# Patient Record
Sex: Female | Born: 1965 | Race: White | Hispanic: No | Marital: Married | Smoking: Never smoker
Health system: Southern US, Community
[De-identification: ages and names within clinical notes are randomized; demographics above are authoritative.]

## PROBLEM LIST (undated history)

## (undated) DIAGNOSIS — R51 Headache: Secondary | ICD-10-CM

## (undated) DIAGNOSIS — N951 Menopausal and female climacteric states: Secondary | ICD-10-CM

## (undated) DIAGNOSIS — R519 Headache, unspecified: Secondary | ICD-10-CM

## (undated) DIAGNOSIS — F32A Depression, unspecified: Secondary | ICD-10-CM

## (undated) DIAGNOSIS — C4491 Basal cell carcinoma of skin, unspecified: Secondary | ICD-10-CM

## (undated) DIAGNOSIS — F419 Anxiety disorder, unspecified: Secondary | ICD-10-CM

## (undated) DIAGNOSIS — K635 Polyp of colon: Secondary | ICD-10-CM

## (undated) DIAGNOSIS — Z87828 Personal history of other (healed) physical injury and trauma: Secondary | ICD-10-CM

## (undated) DIAGNOSIS — T7840XA Allergy, unspecified, initial encounter: Secondary | ICD-10-CM

## (undated) HISTORY — DX: Personal history of other (healed) physical injury and trauma: Z87.828

## (undated) HISTORY — DX: Headache, unspecified: R51.9

## (undated) HISTORY — DX: Allergy, unspecified, initial encounter: T78.40XA

## (undated) HISTORY — DX: Menopausal and female climacteric states: N95.1

## (undated) HISTORY — DX: Polyp of colon: K63.5

## (undated) HISTORY — DX: Headache: R51

## (undated) HISTORY — PX: OTHER SURGICAL HISTORY: SHX169

## (undated) HISTORY — DX: Depression, unspecified: F32.A

## (undated) HISTORY — PX: DENTAL SURGERY: SHX609

## (undated) HISTORY — DX: Anxiety disorder, unspecified: F41.9

## (undated) HISTORY — DX: Basal cell carcinoma of skin, unspecified: C44.91

---

## 2000-04-25 ENCOUNTER — Other Ambulatory Visit: Admission: RE | Admit: 2000-04-25 | Discharge: 2000-04-25 | Payer: Self-pay | Admitting: Obstetrics and Gynecology

## 2002-03-05 ENCOUNTER — Other Ambulatory Visit: Admission: RE | Admit: 2002-03-05 | Discharge: 2002-03-05 | Payer: Self-pay | Admitting: Obstetrics and Gynecology

## 2003-05-16 ENCOUNTER — Other Ambulatory Visit: Admission: RE | Admit: 2003-05-16 | Discharge: 2003-05-16 | Payer: Self-pay | Admitting: Obstetrics and Gynecology

## 2004-06-12 ENCOUNTER — Other Ambulatory Visit: Admission: RE | Admit: 2004-06-12 | Discharge: 2004-06-12 | Payer: Self-pay | Admitting: Obstetrics and Gynecology

## 2005-08-04 ENCOUNTER — Other Ambulatory Visit: Admission: RE | Admit: 2005-08-04 | Discharge: 2005-08-04 | Payer: Self-pay | Admitting: Obstetrics and Gynecology

## 2007-09-28 HISTORY — PX: SHOULDER ARTHROSCOPY W/ ROTATOR CUFF REPAIR: SHX2400

## 2010-11-27 ENCOUNTER — Telehealth: Payer: Self-pay | Admitting: Internal Medicine

## 2010-12-03 NOTE — Progress Notes (Signed)
Summary: NEW PT in APRIL   Phone Note Call from Patient   Summary of Call: Pt set up apt for april - new to re-establish. She says she was seen by Dr Debby Bud a few years ago (no records as we can find).  She c/o slight racing pulse. Has had 2 to 3 epidsodes a few mths ago and last night had 3 more. Describes it as lasting approx 3 seconds and she has tried to feel her pulse but unable to notice any abnormality. No CP, sob or any other complaints. Pt only takes OTC estroven, no prescription meds. Advised her to be evaluated by UC or ER if symptoms returned or had any new symptoms - she agreed.  Initial call taken by: Lamar Sprinkles, CMA,  November 27, 2010 12:03 PM  Follow-up for Phone Call        yep Follow-up by: Jacques Navy MD,  November 27, 2010 2:16 PM

## 2011-01-15 ENCOUNTER — Ambulatory Visit (INDEPENDENT_AMBULATORY_CARE_PROVIDER_SITE_OTHER): Payer: BC Managed Care – PPO | Admitting: Internal Medicine

## 2011-01-15 ENCOUNTER — Encounter: Payer: Self-pay | Admitting: Internal Medicine

## 2011-01-15 VITALS — BP 110/78 | HR 62 | Temp 98.2°F | Wt 150.0 lb

## 2011-01-15 DIAGNOSIS — I872 Venous insufficiency (chronic) (peripheral): Secondary | ICD-10-CM | POA: Insufficient documentation

## 2011-01-15 DIAGNOSIS — G5700 Lesion of sciatic nerve, unspecified lower limb: Secondary | ICD-10-CM

## 2011-01-15 DIAGNOSIS — N951 Menopausal and female climacteric states: Secondary | ICD-10-CM

## 2011-01-15 DIAGNOSIS — I479 Paroxysmal tachycardia, unspecified: Secondary | ICD-10-CM | POA: Insufficient documentation
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
## 2016-01-25 ENCOUNTER — Encounter (HOSPITAL_COMMUNITY): Payer: Self-pay | Admitting: *Deleted

## 2016-01-25 ENCOUNTER — Ambulatory Visit (HOSPITAL_COMMUNITY)
Admission: EM | Admit: 2016-01-25 | Discharge: 2016-01-25 | Disposition: A | Discharge status: Home / Self Care | Payer: BLUE CROSS/BLUE SHIELD | Attending: Emergency Medicine | Admitting: Emergency Medicine

## 2016-01-25 DIAGNOSIS — M79602 Pain in left arm: Secondary | ICD-10-CM

## 2016-01-25 MED ORDER — VALACYCLOVIR HCL 1 G PO TABS: 1000.0000 mg | ORAL_TABLET | Freq: Three times a day (TID) | ORAL | Status: DC

## 2016-01-25 NOTE — ED Notes (Signed)
Pt  Has  A  Rash   And bruising  Appearance  To  The  l  Arm           Pt      Has     Pain   Down the  l    The arm  feelsw  Swollen  And  Tight   l  Hand  Is   Sore   As  Well            X   2  Days     Pt  Reports    Has  Had  Shingles

## 2016-01-25 NOTE — Discharge Instructions (Signed)
Heat Therapy Heat therapy can help ease sore, stiff, injured, and tight muscles and joints. Heat relaxes your muscles, which may help ease your pain.  RISKS AND COMPLICATIONS If you have any of the following conditions, do not use heat therapy unless your health care provider has approved:  Poor circulation.  Healing wounds or scarred skin in the area being treated.  Diabetes, heart disease, or high blood pressure.  Not being able to feel (numbness) the area being treated.  Unusual swelling of the area being treated.  Active infections.  Blood clots.  Cancer.  Inability to communicate pain. This may include young children and people who have problems with their brain function (dementia).  Pregnancy. Heat therapy should only be used on old, pre-existing, or long-lasting (chronic) injuries. Do not use heat therapy on new injuries unless directed by your health care provider. HOW TO USE HEAT THERAPY There are several different kinds of heat therapy, including:  Moist heat pack.  Warm water bath.  Hot water bottle.  Electric heating pad.  Heated gel pack.  Heated wrap.  Electric heating pad. Use the heat therapy method suggested by your health care provider. Follow your health care provider's instructions on when and how to use heat therapy. GENERAL HEAT THERAPY RECOMMENDATIONS  Do not sleep while using heat therapy. Only use heat therapy while you are awake.  Your skin may turn pink while using heat therapy. Do not use heat therapy if your skin turns red.  Do not use heat therapy if you have new pain.  High heat or long exposure to heat can cause burns. Be careful when using heat therapy to avoid burning your skin.  Do not use heat therapy on areas of your skin that are already irritated, such as with a rash or sunburn. SEEK MEDICAL CARE IF:  You have blisters, redness, swelling, or numbness.  You have new pain.  Your pain is worse. MAKE SURE  YOU:  Understand these instructions.  Will watch your condition.  Will get help right away if you are not doing well or get worse.   This information is not intended to replace advice given to you by your health care provider. Make sure you discuss any questions you have with your health care provider.   Document Released: 12/06/2011 Document Revised: 10/04/2014 Document Reviewed: 11/06/2013 Elsevier Interactive Patient Education 2016 Elsevier Inc.  Pain Without a Known Cause WHAT IS PAIN WITHOUT A KNOWN CAUSE? Pain can occur in any part of the body and can range from mild to severe. Sometimes no cause can be found for why you are having pain. Some types of pain that can occur without a known cause include:   Headache.  Back pain.  Abdominal pain.  Neck pain. HOW IS PAIN WITHOUT A KNOWN CAUSE DIAGNOSED?  Your health care provider will try to find the cause of your pain. This may include:  Physical exam.  Medical history.  Blood tests.  Urine tests.  X-rays. If no cause is found, your health care provider may diagnose you with pain without a known cause.  IS THERE TREATMENT FOR PAIN WITHOUT A CAUSE?  Treatment depends on the kind of pain you have. Your health care provider may prescribe medicines to help relieve your pain.  WHAT CAN I DO AT HOME FOR MY PAIN?   Take medicines only as directed by your health care provider.  Stop any activities that cause pain. During periods of severe pain, bed rest may help.  Try to reduce your stress with activities such as yoga or meditation. Talk to your health care provider for other stress-reducing activity recommendations.  Exercise regularly, if approved by your health care provider.  Eat a healthy diet that includes fruits and vegetables. This may improve pain. Talk to your health care provider if you have any questions about your diet. WHAT IF MY PAIN DOES NOT GET BETTER?  If you have a painful condition and no reason can be  found for the pain or the pain gets worse, it is important to follow up with your health care provider. It may be necessary to repeat tests and look further for a possible cause.    This information is not intended to replace advice given to you by your health care provider. Make sure you discuss any questions you have with your health care provider.   Document Released: 06/08/2001 Document Revised: 10/04/2014 Document Reviewed: 01/29/2014 Elsevier Interactive Patient Education 2016 Elsevier Inc. Phlebitis Phlebitis is soreness and puffiness (swelling) in a vein.  HOME CARE  Only take medicine as told by your doctor.  Raise (elevate) the affected limb on a pillow as told by your doctor.  Keep a warm pack on the affected vein as told by your doctor. Do not sleep with a heating pad.  Use special stockings or bandages around the area of the affected vein as told by your doctor. These will speed healing and keep the condition from coming back.  Talk to your doctor about all the medicines you take.  Get follow-up blood tests as told by your doctor.  If the phlebitis is in your legs:  Avoid standing or resting for long periods.  Keep your legs moving. Raise your legs when you sit or lie.  Do not smoke.  Follow-up with your doctor as told. GET HELP IF:  You have strange bruises or bleeding.  Your puffiness or pain in the affected area is not getting better.  You are taking medicine to lessen puffiness (anti-inflammatory medicine), and you get belly pain.  You have a fever. GET HELP RIGHT AWAY IF:   The phlebitis gets worse and you have more pain, puffiness (swelling), or redness.  You have trouble breathing or have chest pain. MAKE SURE YOU:   Understand these instructions.  Will watch your condition.  Will get help right away if you are not doing well or get worse.   This information is not intended to replace advice given to you by your health care provider. Make sure  you discuss any questions you have with your health care provider.   Document Released: 09/01/2009 Document Revised: 09/18/2013 Document Reviewed: 05/21/2013 Elsevier Interactive Patient Education 2016 [place] is an infection that causes a painful skin rash and fluid-filled blisters. Shingles is caused by the same virus that causes chickenpox. Shingles only develops in people who:  Have had chickenpox.  Have gotten the chickenpox vaccine. (This is rare.) The first symptoms of shingles may be itching, tingling, or pain in an area on your skin. A rash will follow in a few days or weeks. The rash is usually on one side of the body in a bandlike or beltlike pattern. Over time, the rash turns into fluid-filled blisters that break open, scab over, and dry up. Medicines may:  Help you manage pain.  Help you recover more quickly.  Help to prevent long-term problems. HOME CARE Medicines  Take medicines only as told by your doctor.  Apply an anti-itch  or numbing cream to the affected area as told by your doctor. Blister and Rash Care  Take a cool bath or put cool compresses on the area of the rash or blisters as told by your doctor. This may help with pain and itching.  Keep your rash covered with a loose bandage (dressing). Wear loose-fitting clothing.  Keep your rash and blisters clean with mild soap and cool water or as told by your doctor.  Check your rash every day for signs of infection. These include redness, swelling, and pain that lasts or gets worse.  Do not pick your blisters.  Do not scratch your rash. General Instructions  Rest as told by your doctor.  Keep all follow-up visits as told by your doctor. This is important.  Until your blisters scab over, your infection can cause chickenpox in people who have never had it or been vaccinated against it. To prevent this from happening, avoid touching other people or being around other people,  especially:  Babies.  Pregnant women.  Children who have eczema.  Elderly people who have transplants.  People who have chronic illnesses, such as leukemia or AIDS. GET HELP IF:  Your pain does not get better with medicine.  Your pain does not get better after the rash heals.  Your rash looks infected. Signs of infection include:  Redness.  Swelling.  Pain that lasts or gets worse. GET HELP RIGHT AWAY IF:  The rash is on your face or nose.  You have pain in your face, pain around your eye area, or loss of feeling on one side of your face.  You have ear pain or you have ringing in your ear.  You have loss of taste.  Your condition gets worse.   This information is not intended to replace advice given to you by your health care provider. Make sure you discuss any questions you have with your health care provider.   Document Released: 03/01/2008 Document Revised: 10/04/2014 Document Reviewed: 06/25/2014 Elsevier Interactive Patient Education [employer].
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
## 2017-12-05 ENCOUNTER — Encounter: Payer: Self-pay | Admitting: Gastroenterology

## 2017-12-21 ENCOUNTER — Other Ambulatory Visit: Payer: Self-pay

## 2017-12-21 ENCOUNTER — Ambulatory Visit (AMBULATORY_SURGERY_CENTER): Payer: Self-pay | Admitting: *Deleted

## 2017-12-21 VITALS — Ht 68.0 in | Wt 154.6 lb

## 2017-12-21 DIAGNOSIS — Z1211 Encounter for screening for malignant neoplasm of colon: Secondary | ICD-10-CM

## 2017-12-21 MED ORDER — NA SULFATE-K SULFATE-MG SULF 17.5-3.13-1.6 GM/177ML PO SOLN: 1.0000 [IU] | Freq: Once | ORAL | 0 refills | Status: AC

## 2017-12-21 NOTE — Progress Notes (Signed)
No egg or soy allergy known to patient  No issues with past sedation with any surgeries  or procedures, no intubation problems  No diet pills per patient No home 02 use per patient  No blood thinners per patient  Pt denies issues with constipation  No A fib or A flutter  EMMI video sent to pt's e mail  

## 2017-12-23 ENCOUNTER — Encounter: Payer: Self-pay | Admitting: Family Medicine

## 2017-12-23 ENCOUNTER — Other Ambulatory Visit: Payer: Self-pay

## 2017-12-23 ENCOUNTER — Ambulatory Visit (INDEPENDENT_AMBULATORY_CARE_PROVIDER_SITE_OTHER): Payer: 59 | Admitting: Family Medicine

## 2017-12-23 VITALS — BP 122/80 | HR 90 | Temp 97.7°F | Resp 16 | Ht 68.0 in | Wt 157.4 lb

## 2017-12-23 DIAGNOSIS — R51 Headache: Secondary | ICD-10-CM | POA: Diagnosis not present

## 2017-12-23 DIAGNOSIS — N941 Unspecified dyspareunia: Secondary | ICD-10-CM | POA: Diagnosis not present

## 2017-12-23 DIAGNOSIS — Z23 Encounter for immunization: Secondary | ICD-10-CM

## 2017-12-23 DIAGNOSIS — E28319 Asymptomatic premature menopause: Secondary | ICD-10-CM | POA: Diagnosis not present

## 2017-12-23 DIAGNOSIS — R519 Headache, unspecified: Secondary | ICD-10-CM

## 2017-12-23 DIAGNOSIS — Z0000 Encounter for general adult medical examination without abnormal findings: Secondary | ICD-10-CM

## 2017-12-23 DIAGNOSIS — R0789 Other chest pain: Secondary | ICD-10-CM

## 2017-12-23 LAB — LIPID PANEL
CHOL/HDL RATIO: 3
CHOLESTEROL: 188 mg/dL (ref 0–200)
HDL: 66.5 mg/dL (ref >39.00)
LDL CALC: 107 mg/dL — AB (ref 0–99)
NonHDL: 121.32
TRIGLYCERIDES: 73 mg/dL (ref 0.0–149.0)
VLDL: 14.6 mg/dL (ref 0.0–40.0)

## 2017-12-23 LAB — COMPREHENSIVE METABOLIC PANEL
ALT: 12 U/L (ref 0–35)
AST: 19 U/L (ref 0–37)
Albumin: 4.3 g/dL (ref 3.5–5.2)
Alkaline Phosphatase: 52 U/L (ref 39–117)
BUN: 10 mg/dL (ref 6–23)
CHLORIDE: 102 meq/L (ref 96–112)
CO2: 32 meq/L (ref 19–32)
Calcium: 9.6 mg/dL (ref 8.4–10.5)
Creatinine, Ser: 0.81 mg/dL (ref 0.40–1.20)
GFR: 78.39 mL/min (ref >60.00)
Glucose, Bld: 103 mg/dL — ABNORMAL HIGH (ref 70–99)
POTASSIUM: 3.5 meq/L (ref 3.5–5.1)
SODIUM: 141 meq/L (ref 135–145)
Total Bilirubin: 0.5 mg/dL (ref 0.2–1.2)
Total Protein: 7.3 g/dL (ref 6.0–8.3)

## 2017-12-23 LAB — CBC WITH DIFFERENTIAL/PLATELET
BASOS PCT: 0.8 % (ref 0.0–3.0)
Basophils Absolute: 0 10*3/uL (ref 0.0–0.1)
EOS ABS: 0.2 10*3/uL (ref 0.0–0.7)
EOS PCT: 2.7 % (ref 0.0–5.0)
HCT: 37.7 % (ref 36.0–46.0)
HEMOGLOBIN: 12.8 g/dL (ref 12.0–15.0)
LYMPHS ABS: 1.7 10*3/uL (ref 0.7–4.0)
Lymphocytes Relative: 30.9 % (ref 12.0–46.0)
MCHC: 33.9 g/dL (ref 30.0–36.0)
MCV: 91.7 fl (ref 78.0–100.0)
MONO ABS: 0.6 10*3/uL (ref 0.1–1.0)
Monocytes Relative: 10.2 % (ref 3.0–12.0)
NEUTROS ABS: 3.1 10*3/uL (ref 1.4–7.7)
Neutrophils Relative %: 55.4 % (ref 43.0–77.0)
PLATELETS: 270 10*3/uL (ref 150.0–400.0)
RBC: 4.11 Mil/uL (ref 3.87–5.11)
RDW: 13 % (ref 11.5–15.5)
WBC: 5.6 10*3/uL (ref 4.0–10.5)

## 2017-12-23 LAB — TSH: TSH: 1.52 u[IU]/mL (ref 0.35–4.50)

## 2017-12-23 LAB — SEDIMENTATION RATE: Sed Rate: 16 mm/hr (ref 0–30)
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
## 2017-12-23 NOTE — Patient Instructions (Signed)
Please return in 12 months for your annual complete physical; please come fasting.  I will release your lab results to you on your MyChart account with further instructions. Please reply with any questions.   Please go to [facility] on [address] for your [place], [address] [place], [place]    It was a pleasure meeting you today! Thank you for choosing [place] to meet your healthcare needs! I truly look forward to working with you. If you have any questions or concerns, please send me a message via Mychart or call the office at [phone].  Please do these things to maintain good health!   Exercise at least 30-45 minutes a day,  4-5 days a week.   Eat a low-fat diet with lots of fruits and vegetables, up to 7-9 servings per day.  Drink plenty of water daily. Try to drink 8 8oz glasses per day.  Seatbelts can save your life. Always wear your seatbelt.  Place Smoke Detectors on every level of your home and check batteries every year.  Schedule an appointment with an eye doctor for an eye exam every 1-2 years  Safe sex - use condoms to protect yourself from STDs if you could be exposed to these types of infections. Use birth control if you do not want to become pregnant and are sexually active.  Avoid heavy alcohol use. If you drink, keep it to less than 2 drinks/day and not every day.  [place].  Choose someone you trust that could speak for you if you became unable to speak for yourself.  Depression is common in our stressful world.If you're feeling down or losing interest in things you normally enjoy, please come in for a visit.  If anyone is threatening or hurting you, please get help. Physical or Emotional Violence is never OK.

## 2017-12-24 LAB — HEPATITIS C ANTIBODY
HEP C AB: NONREACTIVE
SIGNAL TO CUT-OFF: 0.01 (ref <1.00)

## 2017-12-24 LAB — HIV ANTIBODY (ROUTINE TESTING W REFLEX): HIV 1&2 Ab, 4th Generation: NONREACTIVE

## 2017-12-26 ENCOUNTER — Other Ambulatory Visit: Payer: Self-pay | Admitting: Family Medicine

## 2017-12-26 ENCOUNTER — Ambulatory Visit
Admission: RE | Admit: 2017-12-26 | Discharge: 2017-12-26 | Disposition: A | Discharge status: Home / Self Care | Payer: 59 | Source: Ambulatory Visit | Attending: Family Medicine | Admitting: Family Medicine

## 2017-12-26 DIAGNOSIS — R0789 Other chest pain: Secondary | ICD-10-CM

## 2017-12-26 NOTE — Progress Notes (Signed)
Please call patient: I have reviewed his/her lab results. Chest xray and spine films are all normal as well. Follow up if pain recurs or worsens. thanks

## 2017-12-26 NOTE — Progress Notes (Signed)
Please call patient: I have reviewed his/her lab results. All blood tests are normal. No abnormal inflammatory markers or muscle/bone markers to cause alarm regarding chest wall pain. Awaiting xray results. No further testing needed at this time based on these results. Can send letter with results as well. I've routed one to you.

## 2017-12-27 ENCOUNTER — Other Ambulatory Visit: Payer: Self-pay

## 2017-12-27 ENCOUNTER — Encounter: Payer: Self-pay | Admitting: Family Medicine

## 2017-12-27 ENCOUNTER — Ambulatory Visit (INDEPENDENT_AMBULATORY_CARE_PROVIDER_SITE_OTHER): Payer: 59 | Admitting: Family Medicine

## 2017-12-27 ENCOUNTER — Ambulatory Visit: Payer: Self-pay | Admitting: *Deleted

## 2017-12-27 VITALS — BP 130/70 | HR 65 | Temp 98.0°F | Ht 68.0 in | Wt 157.8 lb

## 2017-12-27 DIAGNOSIS — R404 Transient alteration of awareness: Secondary | ICD-10-CM | POA: Diagnosis not present

## 2017-12-27 DIAGNOSIS — R42 Dizziness and giddiness: Secondary | ICD-10-CM | POA: Diagnosis not present

## 2017-12-27 NOTE — Progress Notes (Signed)
Subjective  CC:  Chief Complaint  Patient presents with  . Racing Thoughts    Patient states that she has feelings of impending doom, and she states that her stomach sinks , like her heart falls to her feet.     HPI: Krystal Rose is a 52 y.o. female who presents to the office today to address the problems listed above in the chief complaint.  Please see recent telephone note with information.   Pt reports about 6 months of "episodes or spells" consisting of feelings of deja vu or past experiences associated with sinking feel and impending doom. Happened in early am upon awakening or getting ready in am initially. No other associated sxs at that time but very disconcerting feeling. sxs last for < 1 minute. Started after daughter went to [employer]. More recently, had stress increase due to selling town home and home sick daughter and work. Had multiple similar episodes recently, twice at lunch, and now associated with light headedness.   DENIES headaches, racing heart, sweats, tremor, n/v, paresthesias, paresis. Sleep has been interrupted. She admits to worry about her daughter. No h/o mood problems. She does have a h/o headaches but never diagnosed with migraines. No FH of seizure d/o. Google brought up idea of focal seizures. She is otherwise feeling fine now.   Recent cpe with nl labs.   I reviewed the patients updated PMH, FH, and SocHx.    Patient Active Problem List   Diagnosis Date Noted  . Headache disorder 12/23/2017  . Premature menopause 12/23/2017  . Dyspareunia in female 12/23/2017   Current Meds  Medication Sig  . tretinoin (RETIN-A) 0.01 % gel Apply topically at bedtime.    Allergies: Patient is allergic to elastic bandages & [zinc] and latex. Family History: Patient family history includes Arthritis in her mother; Asthma in her paternal grandfather; COPD in her paternal grandfather; Cancer in her paternal grandfather; Diabetes in her paternal  grandfather; Drug abuse in her paternal grandfather; Healthy in her brother, daughter, daughter, and father; Hearing loss in her maternal grandmother and mother; Heart attack in her maternal grandmother; Heart disease in her maternal grandmother; Heart disease (age of onset: 19) in her maternal uncle; Hyperlipidemia in her mother; Hypertension in her mother; Rheum arthritis in her mother; Stomach cancer in her maternal grandfather. Social History:  Patient  reports that she has never smoked. She has never used smokeless tobacco. She reports that she drinks alcohol. She reports that she does not use drugs.  Review of Systems: Constitutional: Negative for fever malaise or anorexia Cardiovascular: negative for chest pain Respiratory: negative for SOB or persistent cough Gastrointestinal: negative for abdominal pain  Objective  Vitals: BP 130/70   Pulse 65   Temp 98 F (36.7 C)   Ht 5\' 8"  (1.727 m)   Wt 157 lb 12.8 oz (71.6 kg)   BMI 23.99 kg/m  General: no acute distress , A&Ox3 Psych: nl affect, good insight,  nonfocal neuro exam 3/29 not repeated today  Assessment  1. Spell of altered consciousness   2. Lightheadedness      Plan   spells:  ? Etiology: migraine equivalents vs seizure d/o vs stress reaction vs other. Refer to neuro for evaluation. rec stress reduction/mgt. Monitor for sxs of panic or headaches.   Follow up: referred to neuro.     Commons side effects, risks, benefits, and alternatives for medications and treatment plan prescribed today were discussed, and the patient expressed understanding of the  given instructions. Patient is instructed to call or message via MyChart if he/she has any questions or concerns regarding our treatment plan. No barriers to understanding were identified. We discussed Red Flag symptoms and signs in detail. Patient expressed understanding regarding what to do in case of urgent or emergency type symptoms.   Medication list was reconciled,  printed and provided to the patient in AVS. Patient instructions and summary information was reviewed with the patient as documented in the AVS. This note was prepared with assistance of Dragon voice recognition software. Occasional wrong-word or sound-a-like substitutions may have occurred due to the inherent limitations of voice recognition software  Orders Placed This Encounter  Procedures  . Ambulatory referral to Neurology   No orders of the defined types were placed in this encounter.

## 2017-12-27 NOTE — Telephone Encounter (Signed)
Pt called and stated that about 6 months ago she felt a strange since of something that happened years ago, then her stomach sank like she followed and anvil, and then it was gone; she had another episode about 2 weeks later, than multiple episodes; these most happen upon waking in the morning; on Saturday 12/24/17 she had 1 episode in bed, 2 at lunch, 1 when walkiing with neighbor, and 1 at dinner; she says that she also felt pronounced "deja vu"; she did have another episode on 12/26/17; the pt says that she googled this and said that she thinks she is having simple partial seizures; nurse triage initiated and recommendations made per protocol to include seeing a physician within 24 hours; pt offered and accepted appointment with Dr Jonni Sanger, [facility]age today at [phone]; she verbalizes understanding; the pt was seen in office yesterday and had an episode last night; spoke with Benjamine Mola regarding this appointment; will route to office for notification of this upcoming appointment. . Reason for Disposition . 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 ONSET: "How long did the seizure last?" (Minutes)      The episodes last about 30-45 seconds 2. CONTENT: "Describe what happened during the seizure. Did the body become stiff? Was there any jerking?"      no 3. CIRCUMSTANCE: "What was the individual doing when the seizure began?"      They typically happen in the morning when she is in twilight slep or when she gets up 4. MENTAL STATUS: "Does he know who he is, who you are, and where he is?"      yes 5. PRIOR SEIZURES: "Has the individual had a seizure (convulsion) before?" If so, ask: "When was the last time?" and "What happened last time?"     no 6. EPILEPSY: "Does the individual have epilepsy?" (note: check for medical ID bracelet)     no 7. MEDICATIONS: "Does the individual take anticonvulsant medications?" (e.g., yes/no, compliance, any  recent changes)     no 8. INJURY: "Did the individual hurt himself during the seizure?" (e.g., head, tongue)     no 9. OTHER SYMPTOMS: "Are there any other symptoms?" (e.g., fever, headache)     Light headed when walking on 12/24/17 and numbness in upper body when eating dinner on 12/24/17 10. PREGNANCY: "Is there any chance you are pregnant?" "When was your last menstrual period?"       No menopause  Protocols used: [facility]

## 2017-12-27 NOTE — Telephone Encounter (Signed)
Routing to PCP. Not sure why this was sent to me.

## 2017-12-27 NOTE — Patient Instructions (Signed)
Please follow up if symptoms do not improve or as needed.   We will call you with information regarding your referral appointment. Neurology for evaluation.

## 2018-01-02 ENCOUNTER — Ambulatory Visit (AMBULATORY_SURGERY_CENTER): Payer: 59 | Admitting: Gastroenterology

## 2018-01-02 ENCOUNTER — Encounter: Payer: Self-pay | Admitting: Gastroenterology

## 2018-01-02 ENCOUNTER — Other Ambulatory Visit: Payer: Self-pay

## 2018-01-02 VITALS — BP 106/68 | HR 62 | Temp 98.4°F | Resp 12 | Ht 68.0 in | Wt 154.0 lb

## 2018-01-02 DIAGNOSIS — D122 Benign neoplasm of ascending colon: Secondary | ICD-10-CM | POA: Diagnosis not present

## 2018-01-02 DIAGNOSIS — Z1211 Encounter for screening for malignant neoplasm of colon: Secondary | ICD-10-CM | POA: Diagnosis not present

## 2018-01-02 DIAGNOSIS — D123 Benign neoplasm of transverse colon: Secondary | ICD-10-CM

## 2018-01-02 MED ORDER — SODIUM CHLORIDE 0.9 % IV SOLN: 500.0000 mL | Freq: Once | INTRAVENOUS | Status: DC

## 2018-01-02 NOTE — Progress Notes (Signed)
Pt's states no medical or surgical changes since previsit or office visit. 

## 2018-01-02 NOTE — Progress Notes (Signed)
To recovery, report to RN, VSS. 

## 2018-01-02 NOTE — Progress Notes (Signed)
Called to room to assist during endoscopic procedure.  Patient ID and intended procedure confirmed with present staff. Received instructions for my participation in the procedure from the performing physician.  

## 2018-01-02 NOTE — Op Note (Signed)
[place] Patient Name: Parris Signer Procedure Date: 01/02/2018 10:26 AM MRN: [id number] Endoscopist: Mauri Pole , MD Age: 52 Referring MD:  Date of Birth: 1966/06/09 Gender: Female Account #: [id number] Procedure:                Colonoscopy Indications:              Screening for colorectal malignant neoplasm, This                            is the patient's first colonoscopy Medicines:                Monitored Anesthesia Care Procedure:                Pre-Anesthesia Assessment:                           - Prior to the procedure, a History and Physical                            was performed, and patient medications and                            allergies were reviewed. The patient's tolerance of                            previous anesthesia was also reviewed. The risks                            and benefits of the procedure and the sedation                            options and risks were discussed with the patient.                            All questions were answered, and informed consent                            was obtained. Prior Anticoagulants: The patient has                            taken no previous anticoagulant or antiplatelet                            agents. ASA Grade Assessment: I - A normal, healthy                            patient. After reviewing the risks and benefits,                            the patient was deemed in satisfactory condition to                            undergo the procedure.  After obtaining informed consent, the colonoscope                            was passed under direct vision. Throughout the                            procedure, the patient's blood pressure, pulse, and                            oxygen saturations were monitored continuously. The                            Colonoscope was introduced through the anus and                            advanced to the the cecum,  identified by                            appendiceal orifice and ileocecal valve. The                            colonoscopy was performed without difficulty. The                            patient tolerated the procedure well. The quality                            of the bowel preparation was excellent. The                            ileocecal valve, appendiceal orifice, and rectum                            were photographed. Scope In: 10:32:05 AM Scope Out: 10:59:23 AM Scope Withdrawal Time: 0 hours 17 minutes 5 seconds  Total Procedure Duration: 0 hours 27 minutes 18 seconds  Findings:                 The perianal and digital rectal examinations were                            normal.                           Two sessile polyps were found in the transverse                            colon and ascending colon. The polyps were 9 to 12                            mm in size. These polyps were removed with a cold                            snare. Resection and retrieval were complete.  A 3 mm polyp was found in the transverse colon. The                            polyp was flat. The polyp was removed with a cold                            biopsy forceps. Resection and retrieval were                            complete.                           Non-bleeding internal hemorrhoids were found during                            retroflexion. The hemorrhoids were small. Complications:            No immediate complications. Estimated Blood Loss:     Estimated blood loss was minimal. Impression:               - Two 9 to 12 mm polyps in the transverse colon and                            in the ascending colon, removed with a cold snare.                            Resected and retrieved.                           - One 3 mm polyp in the transverse colon, removed                            with a cold biopsy forceps. Resected and retrieved.                           -  Non-bleeding internal hemorrhoids. Recommendation:           - Patient has a contact number available for                            emergencies. The signs and symptoms of potential                            delayed complications were discussed with the                            patient. Return to normal activities tomorrow.                            Written discharge instructions were provided to the                            patient.                           -  Resume previous diet.                           - Continue present medications.                           - Await pathology results.                           - Repeat colonoscopy in 3 years for surveillance                            based on pathology results. Mauri Pole, MD 01/02/2018 11:05:17 AM This report has been signed electronically.

## 2018-01-02 NOTE — Patient Instructions (Signed)
INFORMATION ON POLYPS GIVEN.   YOU HAD AN ENDOSCOPIC PROCEDURE TODAY AT THE [facility]:   Refer to the procedure report that was given to you for any specific questions about what was found during the examination.  If the procedure report does not answer your questions, please call your gastroenterologist to clarify.  If you requested that your care partner not be given the details of your procedure findings, then the procedure report has been included in a sealed envelope for you to review at your convenience later.  YOU SHOULD EXPECT: Some feelings of bloating in the abdomen. Passage of more gas than usual.  Walking can help get rid of the air that was put into your GI tract during the procedure and reduce the bloating. If you had a lower endoscopy (such as a colonoscopy or flexible sigmoidoscopy) you may notice spotting of blood in your stool or on the toilet paper. If you underwent a bowel prep for your procedure, you may not have a normal bowel movement for a few days.  Please Note:  You might notice some irritation and congestion in your nose or some drainage.  This is from the oxygen used during your procedure.  There is no need for concern and it should clear up in a day or so.  SYMPTOMS TO REPORT IMMEDIATELY:   Following lower endoscopy (colonoscopy or flexible sigmoidoscopy):  Excessive amounts of blood in the stool  Significant tenderness or worsening of abdominal pains  Swelling of the abdomen that is new, acute  Fever of 100F or higher    For urgent or emergent issues, a gastroenterologist can be reached at any hour by calling [phone].   DIET:  We do recommend a small meal at first, but then you may proceed to your regular diet.  Drink plenty of fluids but you should avoid alcoholic beverages for 24 hours.  ACTIVITY:  You should plan to take it easy for the rest of today and you should NOT DRIVE or use heavy machinery until tomorrow (because of the sedation  medicines used during the test).    FOLLOW UP: Our staff will call the number listed on your records the next business day following your procedure to check on you and address any questions or concerns that you may have regarding the information given to you following your procedure. If we do not reach you, we will leave a message.  However, if you are feeling well and you are not experiencing any problems, there is no need to return our call.  We will assume that you have returned to your regular daily activities without incident.  If any biopsies were taken you will be contacted by phone or by letter within the next 1-3 weeks.  Please call us at [phone] if you have not heard about the biopsies in 3 weeks.    SIGNATURES/CONFIDENTIALITY: You and/or your care partner have signed paperwork which will be entered into your electronic medical record.  These signatures attest to the fact that that the information above on your After Visit Summary has been reviewed and is understood.  Full responsibility of the confidentiality of this discharge information lies with you and/or your care-partner. 

## 2018-01-03 ENCOUNTER — Telehealth: Payer: Self-pay | Admitting: *Deleted

## 2018-01-03 ENCOUNTER — Encounter: Payer: Self-pay | Admitting: Neurology

## 2018-01-03 NOTE — Telephone Encounter (Signed)
  Follow up Call-  Call back number 01/02/2018  Post procedure Call Back phone  # [phone]  Permission to leave phone message Yes  Some recent data might be hidden     Patient questions:  Do you have a fever, pain , or abdominal swelling? No. Pain Score  0 *  Have you tolerated food without any problems? Yes.    Have you been able to return to your normal activities? Yes.    Do you have any questions about your discharge instructions: Diet   No. Medications  No. Follow up visit  No.  Do you have questions or concerns about your Care? No.  Actions: * If pain score is 4 or above: No action needed, pain <4.

## 2018-01-03 NOTE — Telephone Encounter (Signed)
  Follow up Call-  Call back number 01/02/2018  Post procedure Call Back phone  # [phone]  Permission to leave phone message Yes  Some recent data might be hidden     Patient questions:  Message left to call us if necessary.

## 2018-01-05 ENCOUNTER — Encounter: Payer: Self-pay | Admitting: Gastroenterology

## 2018-01-06 ENCOUNTER — Encounter: Payer: Self-pay | Admitting: Family Medicine

## 2018-01-06 DIAGNOSIS — K635 Polyp of colon: Secondary | ICD-10-CM

## 2018-01-06 HISTORY — DX: Polyp of colon: K63.5

## 2018-02-21 LAB — HM MAMMOGRAPHY

## 2018-03-27 ENCOUNTER — Other Ambulatory Visit: Payer: Self-pay

## 2018-03-27 ENCOUNTER — Ambulatory Visit (INDEPENDENT_AMBULATORY_CARE_PROVIDER_SITE_OTHER): Payer: 59 | Admitting: Neurology

## 2018-03-27 ENCOUNTER — Encounter: Payer: Self-pay | Admitting: Neurology

## 2018-03-27 VITALS — BP 126/60 | HR 67 | Ht 68.0 in | Wt 151.0 lb

## 2018-03-27 DIAGNOSIS — R29818 Other symptoms and signs involving the nervous system: Secondary | ICD-10-CM

## 2018-03-27 NOTE — Patient Instructions (Addendum)
1. Schedule MRI brain with and without contrast  We have sent a referral to [facility] for your MRI and they will call you directly to schedule your appt. They are located at [place]. If you need to contact them directly please call [phone].   2. Schedule 1-hour sleep-deprived EEG, then 48-hour EEG (if routine EEG normal) 3. Follow-up after tests, call for any changes

## 2018-03-27 NOTE — Progress Notes (Signed)
NEUROLOGY CONSULTATION NOTE  Krystal Rose MRN: [id number] DOB: 11-Sep-1966  Referring provider: Dr. Billey Chang Primary care provider: Dr. Billey Chang  Reason for consult:  Spells of altered consciousness  Dear Dr Jonni Sanger:  Thank you for your kind referral of Krystal Rose for consultation of the above symptoms. Although her history is well known to you, please allow me to reiterate it for the purpose of our medical record. She is alone in the office today. Records and images were personally reviewed where available.  HISTORY OF PRESENT ILLNESS: This is a very pleasant 52 year old right-handed woman with no significant past medical history, in her usual state of health until around October 2018 when she started having recurrent episodes of deja vu with associated epigastric sensation. 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 This was followed by a terrible sinking in her stomach like a drop on a roller coaster, then a wave of deep despair. Symptoms lasted 30 seconds or so, but recalls the sensation as feeling "horrible and painful." She had 2-3 of these over the next month or so, all occurring upon awakening. For a month she was symptom-free, then symptoms recurred occurring every 1-2 weeks, sometimes in clusters of 3-4 in 2 weeks. She described some of them, in February she had 4 in one night. In March, she had 3 episodes. With one of them, she had 5 in one day, one with associated lightheadedness like she would faint, feeling tingly in her face, then another where she felt tingling all over. The next day, she had a milder episode while in bed, again with some kind of memory like a classroom/library. In May she had 2 episodes in one day upon waking, she felt cool/tingly around the lips. She had 4 episodes in June, with one of them she had a familiar memory of a person who she was not  sure if she knew, then her stomach felt upset with nausea and lip tingling. The next day she had 3 milder episodes early morning, waking up up from sleep. She had 2 more that day lasting 1-2 minutes. The last episode occurred on 03/24/18, it was different because she was looking at a specific accent table picture online and felt the same sensation, she tried to scroll through other tables, then came back to the same table and again had the sensation. There is no associated olfactory or gustatory hallucination except for one time when she had several in one night and smelled lemons in their bedroom. No associated focal weakness or post-event fatigue. No staring/unresponsive episodes, gaps in time, or myoclonic jerks. Sometimes she feels a sensation that it is going to happen, like if she looks to the right side it may occur. No clear triggers, maybe sleep. She gets an average of 6-7 hours of sleep. She has had headaches recently, but unrelated to these. She has headaches around once a week, either on the right hemisphere or the top of her head, no associated nausea/vomiting/phono/photophobia, with good response to Excedrin migraine. She feels the vision on her right eye varies, she had an eye exam with no significant abnormality reported. She recalls one episode 2 weeks ago while drinking 1 glass of martini when she felt she was slurring her speech for an hour, no other associated symptoms. She had pain in her shoulder blades 11 months ago and could not  move for 3 days, this occurred at the peak of her stress when her daughter went to start [employer]. She reports that all of the above symptoms started around the time of stress with her daughter.   Epilepsy Risk Factors:  She had a normal birth and early development.  There is no history of febrile convulsions, CNS infections such as meningitis/encephalitis, significant traumatic brain injury, neurosurgical procedures, or family history of  seizures.  PAST MEDICAL HISTORY: Past Medical History:  Diagnosis Date  . Allergy   . Basal cell carcinoma   . Benign colon polyp 01/06/2018   Colonoscopy 12/2017  . Frequent headaches   . History of whiplash injury to neck '04   recovered  . Menopausal state     PAST SURGICAL HISTORY: Past Surgical History:  Procedure Laterality Date  . basal cell carcinoma removed     09/2017  . DENTAL SURGERY     apicoectomy '06  . SHOULDER ARTHROSCOPY W/ ROTATOR CUFF REPAIR  2009   left  shoulder    MEDICATIONS: Current Outpatient Medications on File Prior to Visit  Medication Sig Dispense Refill  . conjugated estrogens (PREMARIN) vaginal cream Place 1 Applicatorful vaginally daily.    Marland Kitchen ibuprofen (ADVIL,MOTRIN) 200 MG tablet Take 400 mg by mouth every 8 (eight) hours as needed.    . tretinoin (RETIN-A) 0.01 % gel Apply topically at bedtime.     Current Facility-Administered Medications on File Prior to Visit  Medication Dose Route Frequency Provider Last Rate Last Dose  . 0.9 %  sodium chloride infusion  500 mL Intravenous Once Nandigam, Venia Minks, MD        ALLERGIES: Allergies  Allergen Reactions  . Elastic Bandages & [Zinc]     Elastic in bathing suits and causes contact dermatitis  . Latex     Causes contact dermatitis    FAMILY HISTORY: Family History  Problem Relation Age of Onset  . Hypertension Mother   . Hyperlipidemia Mother   . Rheum arthritis Mother   . Arthritis Mother   . Hearing loss Mother   . Healthy Father   . Diabetes Paternal Grandfather   . COPD Paternal Grandfather   . Asthma Paternal Grandfather   . Cancer Paternal Grandfather   . Drug abuse Paternal Grandfather   . Healthy Brother   . Heart disease Maternal Uncle 80       died of MI  . Heart disease Maternal Grandmother   . Hearing loss Maternal Grandmother   . Heart attack Maternal Grandmother   . Stomach cancer Maternal Grandfather   . Healthy Daughter   . Healthy Daughter   . Colon  polyps Neg Hx   . Colon cancer Neg Hx   . Esophageal cancer Neg Hx   . Rectal cancer Neg Hx     SOCIAL HISTORY: Social History   Socioeconomic History  . Marital status: Married    Spouse name: Not on file  . Number of children: 2  . Years of education: 39  . Highest education level: Not on file  Occupational History  . Occupation: Nurse, children's, vestibular    Comment: works at [employer] 2 days per week  Social Needs  . Financial resource strain: Not on file  . Food insecurity:    Worry: Not on file    Inability: Not on file  . Transportation needs:    Medical: Not on file    Non-medical: Not on file  Tobacco Use  . Smoking  status: Never Smoker  . Smokeless tobacco: Never Used  Substance and Sexual Activity  . Alcohol use: Yes    Alcohol/week: 0.3 oz    Types: 1 Standard drinks or equivalent per week    Comment: occasionally  . Drug use: No  . Sexual activity: Yes    Partners: Male  Lifestyle  . Physical activity:    Days per week: Not on file    Minutes per session: Not on file  . Stress: Not on file  Relationships  . Social connections:    Talks on phone: Not on file    Gets together: Not on file    Attends religious service: Not on file    Active member of club or organization: Not on file    Attends meetings of clubs or organizations: Not on file    Relationship status: Not on file  . Intimate partner violence:    Fear of current or ex partner: Not on file    Emotionally abused: Not on file    Physically abused: Not on file    Forced sexual activity: Not on file  Other Topics Concern  . 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  Married '87. 2 dtrs - '96, '98. 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 Marriage in good health. No history of physical or sexual abuse.     REVIEW OF SYSTEMS: Constitutional: No fevers, chills, or sweats, no generalized fatigue, change in appetite Eyes: No visual  changes, double vision, eye pain Ear, nose and throat: No hearing loss, ear pain, nasal congestion, sore throat Cardiovascular: No chest pain, palpitations Respiratory:  No shortness of breath at rest or with exertion, wheezes GastrointestinaI: No nausea, vomiting, diarrhea, abdominal pain, fecal incontinence Genitourinary:  No dysuria, urinary retention or frequency Musculoskeletal:  No neck pain, back pain Integumentary: No rash, pruritus, skin lesions Neurological: as above Psychiatric: No depression, insomnia, anxiety Endocrine: No palpitations, fatigue, diaphoresis, mood swings, change in appetite, change in weight, increased thirst Hematologic/Lymphatic:  No anemia, purpura, petechiae. Allergic/Immunologic: no itchy/runny eyes, nasal congestion, recent allergic reactions, rashes  PHYSICAL EXAM: Vitals:   03/27/18 1036  BP: 126/60  Pulse: 67  SpO2: 98%   General: No acute distress Head:  Normocephalic/atraumatic Eyes: Fundoscopic exam shows bilateral sharp discs, no vessel changes, exudates, or hemorrhages Neck: supple, no paraspinal tenderness, full range of motion Back: No paraspinal tenderness Heart: regular rate and rhythm Lungs: Clear to auscultation bilaterally. Vascular: No carotid bruits. Skin/Extremities: No rash, no edema Neurological Exam: Mental status: alert and oriented to person, place, and time, no dysarthria or aphasia, Fund of knowledge is appropriate.  Recent and remote memory are intact. 3/3 delayed recall. Attention and concentration are normal.    Able to name objects and repeat phrases. Cranial nerves: CN I: not tested CN II: pupils equal, round and reactive to light, visual fields intact, fundi unremarkable. CN III, IV, VI:  full range of motion, no nystagmus, no ptosis CN V: facial sensation intact CN VII: upper and lower face symmetric CN VIII: hearing intact to finger rub CN IX, X: gag intact, uvula midline CN XI: sternocleidomastoid and  trapezius muscles intact CN XII: tongue midline Bulk & Tone: normal, no fasciculations. Motor: 5/5 throughout with no pronator drift. Sensation: intact to light touch, cold, pin, vibration and joint position sense.  No extinction to double simultaneous stimulation.  Romberg test negative Deep Tendon Reflexes: brisk +2 throughout, no ankle clonus, negative Hoffman  sign Plantar responses: downgoing bilaterally Cerebellar: no incoordination on finger to nose, heel to shin. No dysdiadochokinesia Gait: narrow-based and steady, able to tandem walk adequately. 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 Her neurological exam is normal. Etiology of episodes unclear, we discussed how simple partial seizures arising from the temporal lobe can have this semiology, she denies any episodes of loss of awareness. They started around the time of increased stress with her daughter joining the [employer]. MRI brain with and without contrast and a 1-hour sleep-deprived EEG will be ordered to assess for focal abnormalities that increase risk for recurrent seizures. If normal, a 48-hour EEG will be done to further classify her symptoms. We discussed [place] driving laws to stop driving if she starts having loss of awareness/consciousness. She will follow-up after the tests and knows to call for any changes.   Thank you for allowing me to participate in the care of this patient. Please do not hesitate to call for any questions or concerns.   Ellouise Newer, M.D.  CC: Dr. Jonni Sanger

## 2018-03-28 ENCOUNTER — Ambulatory Visit (INDEPENDENT_AMBULATORY_CARE_PROVIDER_SITE_OTHER): Payer: 59 | Admitting: Neurology

## 2018-03-28 DIAGNOSIS — R29818 Other symptoms and signs involving the nervous system: Secondary | ICD-10-CM

## 2018-04-04 NOTE — Procedures (Signed)
ELECTROENCEPHALOGRAM REPORT  Date of Study: 03/28/2018  Patient's Name: Krystal Rose MRN: [id number] Date of Birth: 04/28/1966  Referring Provider: Dr. Ellouise Newer  Clinical History: This is a 52 year old woman with recurrent stereotyped episodes of deja vu with epigastric sensation. EEG for classification.  Medications: Premarin, Advil  Technical Summary: A multichannel digital 1-hour sleep-deprived EEG recording measured by the international 10-20 system with electrodes applied with paste and impedances below 5000 ohms performed in our laboratory with EKG monitoring in an awake and asleep patient.  Hyperventilation and photic stimulation were performed.  The digital EEG was referentially recorded, reformatted, and digitally filtered in a variety of bipolar and referential montages for optimal display.    Description: The patient is awake and asleep during the recording.  During maximal wakefulness, there is a symmetric, medium voltage 8-8.5 Hz posterior dominant rhythm that attenuates with eye opening.  The record is symmetric.  During drowsiness and sleep, there is an increase in theta slowing of the background, at times sharply contoured over the left temporal region without clear epileptogenic potential. Vertex waves and symmetric sleep spindles were seen.  Hyperventilation and photic stimulation did not elicit any abnormalities.  There were no epileptiform discharges or electrographic seizures seen.    EKG lead was unremarkable.  Impression: This 1-hour awake and asleep EEG is normal.    Clinical Correlation: A normal EEG does not exclude a clinical diagnosis of epilepsy.  If further clinical questions remain, prolonged EEG may be helpful.  Clinical correlation is advised.   Ellouise Newer, M.D.

## 2018-04-07 ENCOUNTER — Ambulatory Visit (HOSPITAL_COMMUNITY)
Admission: RE | Admit: 2018-04-07 | Discharge: 2018-04-07 | Disposition: A | Discharge status: Home / Self Care | Payer: 59 | Source: Ambulatory Visit | Attending: Neurology | Admitting: Neurology

## 2018-04-07 DIAGNOSIS — R29818 Other symptoms and signs involving the nervous system: Secondary | ICD-10-CM | POA: Diagnosis present

## 2018-04-07 MED ORDER — GADOBENATE DIMEGLUMINE 529 MG/ML IV SOLN
15.0000 mL | Freq: Once | INTRAVENOUS | Status: AC | PRN
  Administered 2018-04-07: 15 mL via INTRAVENOUS

## 2018-04-10 ENCOUNTER — Ambulatory Visit (INDEPENDENT_AMBULATORY_CARE_PROVIDER_SITE_OTHER): Payer: 59 | Admitting: Neurology

## 2018-04-10 DIAGNOSIS — R29818 Other symptoms and signs involving the nervous system: Secondary | ICD-10-CM

## 2018-04-13 ENCOUNTER — Telehealth: Payer: Self-pay

## 2018-04-13 NOTE — Telephone Encounter (Signed)
Spoke with pt relaying message below.   

## 2018-04-13 NOTE — Telephone Encounter (Signed)
-----   Message from Cameron Sprang, MD sent at 04/10/2018 12:26 PM EDT ----- Pls let her know MRI brain is normal, no evidence of tumor, stroke, or bleed. Thanks

## 2018-04-19 ENCOUNTER — Telehealth: Payer: Self-pay | Admitting: Neurology

## 2018-04-19 NOTE — Procedures (Signed)
ELECTROENCEPHALOGRAM REPORT  Dates of Recording: 04/10/2018 10:13AM to 04/12/2018 9:22AM  Patient's Name: Krystal Rose MRN: [id number] Date of Birth: 04-08-1966  Referring Provider: Dr. Ellouise Newer  Procedure: 48-hour ambulatory EEG  History: This is a 52 year old woman with new onset recurrent stereotyped episodes of deja vu with epigastric sensation. EEG for classification.  Medications: Premarin, Advil  Technical Summary: This is a 48-hour multichannel digital EEG recording measured by the international 10-20 system with electrodes applied with paste and impedances below 5000 ohms performed as portable with EKG monitoring.  The digital EEG was referentially recorded, reformatted, and digitally filtered in a variety of bipolar and referential montages for optimal display.    DESCRIPTION OF RECORDING: During maximal wakefulness, the background activity consisted of a symmetric 9 Hz posterior dominant rhythm which was reactive to eye opening.  There were no epileptiform discharges or focal slowing seen in wakefulness.  During the recording, the patient progresses through wakefulness, drowsiness, and Stage 2 sleep. There were rare instances of focal 4-5 Hz theta slowing seen over the left temporal region during sleep.   Again, there were no epileptiform discharges seen.  Events: On 07/15 at 1630 hours, she reports her stomach felt funny. Electrographically, there were no EEG or EKG changes seen.  On 07/16 at 0530 hours, she felt her stomach rising/sinking (mild). Electrographically, there were no EEG or EKG changes seen.  There were no electrographic seizures seen.  EKG lead was unremarkable.  IMPRESSION: This 48-hour ambulatory EEG study is minimally abnormal due to rare focal slowing over the left temporal region seen during sleep.  CLINICAL CORRELATION of the above findings indicates focal cerebral dysfunction over the left temporal region suggestive of underlying structural  or physiologic abnormality. There were no epileptiform discharges seen. Mild episodes of epigastric sensation did not show any epileptiform correlate. If further clinical questions remain, inpatient video EEG monitoring may be helpful.   Ellouise Newer, M.D.

## 2018-04-19 NOTE — Telephone Encounter (Signed)
Spoke to patient about results of 48-hour EEG, no epileptiform discharges. There was rare focal slowing on the left temporal region. Had an extensive discussion about simple partial sz that can have negative scalp EEG, she asked about non-epileptic events, which is still a possibility. We discussed doing a therapeutic trial with either zonisamide or lamotrigine, she would like to continue with symptom calendar for now. She reports having 5 last Saturday, 1 on Sunday. She felt bad (hard to describe, like bad gremlins or shadow waiting), did not feel good until 2 days after. Woke up in middle night at 2:30am and felt better. She feels like they are changing. Feels much better with normal tests. Seems to come every 3 weeks or so. She will update me in 2 months.

## 2018-08-11 ENCOUNTER — Encounter: Payer: Self-pay | Admitting: Family Medicine

## 2018-08-11 ENCOUNTER — Other Ambulatory Visit: Payer: Self-pay

## 2018-08-11 ENCOUNTER — Ambulatory Visit (INDEPENDENT_AMBULATORY_CARE_PROVIDER_SITE_OTHER): Payer: 59 | Admitting: Family Medicine

## 2018-08-11 VITALS — BP 100/64 | HR 90 | Temp 98.0°F | Ht 68.0 in | Wt 145.4 lb

## 2018-08-11 DIAGNOSIS — R404 Transient alteration of awareness: Secondary | ICD-10-CM

## 2018-08-11 DIAGNOSIS — F410 Panic disorder [episodic paroxysmal anxiety] without agoraphobia: Secondary | ICD-10-CM

## 2018-08-11 MED ORDER — ALPRAZOLAM 0.5 MG PO TABS: 0.5000 mg | ORAL_TABLET | Freq: Every day | ORAL | 0 refills | Status: DC | PRN

## 2018-08-11 MED ORDER — ESCITALOPRAM OXALATE 10 MG PO TABS: 10.0000 mg | ORAL_TABLET | Freq: Every day | ORAL | 2 refills | Status: DC

## 2018-08-11 NOTE — Patient Instructions (Signed)
Please return in 6 weeks to recheck mood. Start taking lexapro daily.  You may use the xanax if you have an episode to see if it helps.  If you have any questions or concerns, please don't hesitate to send me a message via MyChart or call the office at [phone]. Thank you for visiting with [place] today! It's our pleasure caring for you.  Panic Attack A panic attack is a sudden episode of severe anxiety, fear, or discomfort that causes physical and emotional symptoms. The attack may be in response to something frightening, or it may occur for no known reason. Symptoms of a panic attack can be similar to symptoms of a heart attack or stroke. It is important to see your health care provider when you have a panic attack so that these conditions can be ruled out. A panic attack is a symptom of another condition. Most panic attacks go away with treatment of the underlying problem. If you have panic attacks often, you may have a condition called panic disorder. What are the causes? A panic attack may be caused by:  An extreme, life-threatening situation, such as a war or natural disaster.  An anxiety disorder, such as post-traumatic stress disorder.  Depression.  Certain medical conditions, including heart problems, neurological conditions, and infections.  Certain over-the-counter and prescription medicines.  Illegal drugs that increase heart rate and blood pressure, such as methamphetamine.  Alcohol.  Supplements that increase anxiety.  Panic disorder.  What increases the risk? You are more likely to develop this condition if:  You have an anxiety disorder.  You have another mental health condition.  You take certain medicines.  You use alcohol, illegal drugs, or other substances.  You are under extreme stress.  A life event is causing increased feelings of anxiety and depression.  What are the signs or symptoms? A panic attack starts suddenly, usually lasts about 20  minutes, and occurs with one or more of the following:  A pounding heart.  A feeling that your heart is beating irregularly or faster than normal (palpitations).  Sweating.  Trembling or shaking.  Shortness of breath or feeling smothered.  Feeling choked.  Chest pain or discomfort.  Nausea or a strange feeling in your stomach.  Dizziness, feeling lightheaded, or feeling like you might faint.  Chills or hot flashes.  Numbness or tingling in your lips, hands, or feet.  Feeling confused, or feeling that you are not yourself.  Fear of losing control or being emotionally unstable.  Fear of dying.  How is this diagnosed? A panic attack is diagnosed with an assessment by your health care provider. During the assessment your health care provider will ask questions about:  Your history of anxiety, depression, and panic attacks.  Your medical history.  Whether you drink alcohol, use illegal drugs, take supplements, or take medicines. Be honest about your substance use.  Your health care provider may also:  Order blood tests or other kinds of tests to rule out serious medical conditions.  Refer you to a mental health professional for further evaluation.  How is this treated? Treatment depends on the cause of the panic attack:  If the cause is a medical problem, your health care provider will either treat that problem or refer you to a specialist.  If the cause is emotional, you may be given anti-anxiety medicines or referred to a counselor. These medicines may reduce how often attacks happen, reduce how severe the attacks are, and lower anxiety.  If  the cause is a medicine, your health care provider may tell you to stop the medicine, change your dose, or take a different medicine.  If the cause is a drug, treatment may involve letting the drug wear off and taking medicine to help the drug leave your body or to counteract its effects. Attacks caused by drug abuse may  continue even if you stop using the drug.  Follow these instructions at home:  Take over-the-counter and prescription medicines only as told by your health care provider.  If you feel anxious, limit your caffeine intake.  Take good care of your physical and mental health by: ? Eating a balanced diet that includes plenty of fresh fruits and vegetables, whole grains, lean meats, and low-fat dairy. ? Getting plenty of rest. Try to get 7-8 hours of uninterrupted sleep each night. ? Exercising regularly. Try to get 30 minutes of physical activity at least 5 days a week. ? Not smoking. Talk to your health care provider if you need help quitting. ? Limiting alcohol intake to no more than 1 drink a day for nonpregnant women and 2 drinks a day for men. One drink equals 12 oz of beer, 5 oz of wine, or 1 oz of hard liquor.  Keep all follow-up visits as told by your health care provider. This is important. Panic attacks may have underlying physical or emotional problems that take time to accurately diagnose. Contact a health care provider if:  Your symptoms do not improve, or they get worse.  You are not able to take your medicine as prescribed because of side effects. Get help right away if:  You have serious thoughts about hurting yourself or others.  You have symptoms of a panic attack. Do not drive yourself to the hospital. Have someone else drive you or call an ambulance. If you ever feel like you may hurt yourself or others, or you have thoughts about taking your own life, get help right away. You can go to your nearest emergency department or call:  Your local emergency services (911 in the [place]  A suicide crisis helpline, such as the [facility] at 318-704-0691. This is open 24 hours a day.  Summary  A panic attack is a sign of a serious health or mental health condition. Get help right away. Do not drive yourself to the hospital. Have someone else drive  you or call an ambulance.  Always see a health care provider to have the reasons for the panic attack correctly diagnosed.  If your panic attack was caused by a physical problem, follow your health care provider's suggestions for medicine, referral to a specialist, and lifestyle changes.  If your panic attack was caused by an emotional problem, follow through with counseling from a qualified mental health specialist.  If you feel like you may hurt yourself or others, call 911 and get help right away. This information is not intended to replace advice given to you by your health care provider. Make sure you discuss any questions you have with your health care provider. Document Released: 09/13/2005 Document Revised: 10/22/2016 Document Reviewed: 10/22/2016 Elsevier Interactive Patient Education  2018 [employer].   Generalized Anxiety Disorder, Adult Generalized anxiety disorder (GAD) is a mental health disorder. People with this condition constantly worry about everyday events. Unlike normal anxiety, worry related to GAD is not triggered by a specific event. These worries also do not fade or get better with time. GAD interferes with life functions, including relationships,  work, and school. GAD can vary from mild to severe. People with severe GAD can have intense waves of anxiety with physical symptoms (panic attacks). What are the causes? The exact cause of GAD is not known. What increases the risk? This condition is more likely to develop in:  Women.  People who have a family history of anxiety disorders.  People who are very shy.  People who experience very stressful life events, such as the death of a loved one.  People who have a very stressful family environment.  What are the signs or symptoms? People with GAD often worry excessively about many things in their lives, such as their health and family. They may also be overly concerned about:  Doing well at work.  Being on  time.  Natural disasters.  Friendships.  Physical symptoms of GAD include:  Fatigue.  Muscle tension or having muscle twitches.  Trembling or feeling shaky.  Being easily startled.  Feeling like your heart is pounding or racing.  Feeling out of breath or like you cannot take a deep breath.  Having trouble falling asleep or staying asleep.  Sweating.  Nausea, diarrhea, or irritable bowel syndrome (IBS).  Headaches.  Trouble concentrating or remembering facts.  Restlessness.  Irritability.  How is this diagnosed? Your health care provider can diagnose GAD based on your symptoms and medical history. You will also have a physical exam. The health care provider will ask specific questions about your symptoms, including how severe they are, when they started, and if they come and go. Your health care provider may ask you about your use of alcohol or drugs, including prescription medicines. Your health care provider may refer you to a mental health specialist for further evaluation. Your health care provider will do a thorough examination and may perform additional tests to rule out other possible causes of your symptoms. To be diagnosed with GAD, a person must have anxiety that:  Is out of his or her control.  Affects several different aspects of his or her life, such as work and relationships.  Causes distress that makes him or her unable to take part in normal activities.  Includes at least three physical symptoms of GAD, such as restlessness, fatigue, trouble concentrating, irritability, muscle tension, or sleep problems.  Before your health care provider can confirm a diagnosis of GAD, these symptoms must be present more days than they are not, and they must last for six months or longer. How is this treated? The following therapies are usually used to treat GAD:  Medicine. Antidepressant medicine is usually prescribed for long-term daily control. Antianxiety  medicines may be added in severe cases, especially when panic attacks occur.  Talk therapy (psychotherapy). Certain types of talk therapy can be helpful in treating GAD by providing support, education, and guidance. Options include: ? Cognitive behavioral therapy (CBT). People learn coping skills and techniques to ease their anxiety. They learn to identify unrealistic or negative thoughts and behaviors and to replace them with positive ones. ? Acceptance and commitment therapy (ACT). This treatment teaches people how to be mindful as a way to cope with unwanted thoughts and feelings. ? Biofeedback. This process trains you to manage your body's response (physiological response) through breathing techniques and relaxation methods. You will work with a therapist while machines are used to monitor your physical symptoms.  Stress management techniques. These include yoga, meditation, and exercise.  A mental health specialist can help determine which treatment is best for you. Some people  see improvement with one type of therapy. However, other people require a combination of therapies. Follow these instructions at home:  Take over-the-counter and prescription medicines only as told by your health care provider.  Try to maintain a normal routine.  Try to anticipate stressful situations and allow extra time to manage them.  Practice any stress management or self-calming techniques as taught by your health care provider.  Do not punish yourself for setbacks or for not making progress.  Try to recognize your accomplishments, even if they are small.  Keep all follow-up visits as told by your health care provider. This is important. Contact a health care provider if:  Your symptoms do not get better.  Your symptoms get worse.  You have signs of depression, such as: ? A persistently sad, cranky, or irritable mood. ? Loss of enjoyment in activities that used to bring you joy. ? Change in weight  or eating. ? Changes in sleeping habits. ? Avoiding friends or family members. ? Loss of energy for normal tasks. ? Feelings of guilt or worthlessness. Get help right away if:  You have serious thoughts about hurting yourself or others. If you ever feel like you may hurt yourself or others, or have thoughts about taking your own life, get help right away. You can go to your nearest emergency department or call:  Your local emergency services (911 in the [place]  A suicide crisis helpline, such as the [facility] at 228-080-0559. This is open 24 hours a day.  Summary  Generalized anxiety disorder (GAD) is a mental health disorder that involves worry that is not triggered by a specific event.  People with GAD often worry excessively about many things in their lives, such as their health and family.  GAD may cause physical symptoms such as restlessness, trouble concentrating, sleep problems, frequent sweating, nausea, diarrhea, headaches, and trembling or muscle twitching.  A mental health specialist can help determine which treatment is best for you. Some people see improvement with one type of therapy. However, other people require a combination of therapies. This information is not intended to replace advice given to you by your health care provider. Make sure you discuss any questions you have with your health care provider. Document Released: 01/08/2013 Document Revised: 08/03/2016 Document Reviewed: 08/03/2016 Elsevier Interactive Patient Education  [employer].

## 2018-08-11 NOTE — Progress Notes (Signed)
Subjective  CC:  Chief Complaint  Patient presents with  . Anxiety    feels shaky and gets faint and feeling like she has impending doom, she states that she gets foggy brain, she states that its 1-2 days every 4-6 weeks     HPI: Krystal Rose is a 52 y.o. female who presents to the office today to address the problems listed above in the chief complaint. I've personally reviewed recent office visit notes from Dr. Delice Lesch, neurology, associated labs and imaging reports and/or pertinent outside office records via chart review or [place]. Briefly, evaluated for spells of altered consciousness with nl MRI, sleep deprived EEG and 48 hour EEG. All were mostly normal but possibility of focal partial seizure was mentioned. Pt declined trial of antieleptics due to perceived risks/side effects    since, her spells persist as above. We reviewed her stresses over the year, her underlying worsening daily anxiety sxs and her strong desire to not appear weak or unwell. Recent spells were during times of stress and are now including tremulousness, feeling like she could pass out and tingling. GAD 7 : Generalized Anxiety Score 08/11/2018 12/27/2017  Nervous, Anxious, on Edge 1 1  Control/stop worrying 1 0  Worry too much - different things 0 0  Trouble relaxing 1 0  Restless 0 0  Easily annoyed or irritable 0 1  Afraid - awful might happen 1 0  Total GAD 7 Score 4 2  Anxiety Difficulty Not difficult at all Not difficult at all     Assessment  1. Panic attack   2. Spell of altered consciousness      Plan      Possible panic attacks:  Counseling done at length. Trial of xanax as needed and lexapro. Educated on SSRI and expectations.   Follow up: Return in about 6 weeks (around 09/22/2018) for mood follow up.   No orders of the defined types were placed in this encounter.  Meds ordered this encounter  Medications  . escitalopram (LEXAPRO) 10 MG tablet    Sig: Take 1 tablet (10 mg  total) by mouth daily.    Dispense:  30 tablet    Refill:  2  . ALPRAZolam (XANAX) 0.5 MG tablet    Sig: Take 1 tablet (0.5 mg total) by mouth daily as needed for anxiety (panic).    Dispense:  30 tablet    Refill:  0      I reviewed the patients updated PMH, FH, and SocHx.    Patient Active Problem List   Diagnosis Date Noted  . Benign colon polyp 01/06/2018  . Headache disorder 12/23/2017  . Premature menopause 12/23/2017  . Dyspareunia in female 12/23/2017   Current Meds  Medication Sig  . ibuprofen (ADVIL,MOTRIN) 200 MG tablet Take 400 mg by mouth every 8 (eight) hours as needed.    Allergies: Patient is allergic to elastic bandages & [zinc] and latex. Family History: Patient family history includes Arthritis in her mother; Asthma in her paternal grandfather; COPD in her paternal grandfather; Cancer in her paternal grandfather; Diabetes in her paternal grandfather; Drug abuse in her paternal grandfather; Healthy in her brother, daughter, daughter, and father; Hearing loss in her maternal grandmother and mother; Heart attack in her maternal grandmother; Heart disease in her maternal grandmother; Heart disease (age of onset: 62) in her maternal uncle; Hyperlipidemia in her mother; Hypertension in her mother; Rheum arthritis in her mother; Stomach cancer in her maternal grandfather. Social History:  Patient  reports that she has never smoked. She has never used smokeless tobacco. She reports that she drinks about 0.5 standard drinks of alcohol per week. She reports that she does not use drugs.  Review of Systems: Constitutional: Negative for fever malaise or anorexia Cardiovascular: negative for chest pain Respiratory: negative for SOB or persistent cough Gastrointestinal: negative for abdominal pain  Objective  Vitals: BP 100/64   Pulse 90   Temp 98 F (36.7 C)   Ht 5\' 8"  (1.727 m)   Wt 145 lb 6.4 oz (66 kg)   SpO2 98%   BMI 22.11 kg/m  General: no acute distress ,  A&Ox3 HEENT: PEERL, conjunctiva normal, Oropharynx moist,neck is supple Cardiovascular:  RRR without murmur or gallop.  Respiratory:  Good breath sounds bilaterally, CTAB with normal respiratory effort Skin:  Warm, no rashes     Commons side effects, risks, benefits, and alternatives for medications and treatment plan prescribed today were discussed, and the patient expressed understanding of the given instructions. Patient is instructed to call or message via MyChart if he/she has any questions or concerns regarding our treatment plan. No barriers to understanding were identified. We discussed Red Flag symptoms and signs in detail. Patient expressed understanding regarding what to do in case of urgent or emergency type symptoms.   Medication list was reconciled, printed and provided to the patient in AVS. Patient instructions and summary information was reviewed with the patient as documented in the AVS. This note was prepared with assistance of Dragon voice recognition software. Occasional wrong-word or sound-a-like substitutions may have occurred due to the inherent limitations of voice recognition software

## 2018-08-29 ENCOUNTER — Encounter: Payer: Self-pay | Admitting: Emergency Medicine

## 2018-09-22 ENCOUNTER — Ambulatory Visit (INDEPENDENT_AMBULATORY_CARE_PROVIDER_SITE_OTHER): Payer: 59 | Admitting: Family Medicine

## 2018-09-22 ENCOUNTER — Other Ambulatory Visit: Payer: Self-pay

## 2018-09-22 ENCOUNTER — Encounter: Payer: Self-pay | Admitting: Family Medicine

## 2018-09-22 VITALS — BP 116/76 | HR 67 | Temp 98.5°F | Resp 16 | Ht 68.0 in | Wt 148.2 lb

## 2018-09-22 DIAGNOSIS — R404 Transient alteration of awareness: Secondary | ICD-10-CM | POA: Diagnosis not present

## 2018-09-22 DIAGNOSIS — F410 Panic disorder [episodic paroxysmal anxiety] without agoraphobia: Secondary | ICD-10-CM | POA: Diagnosis not present

## 2018-09-22 DIAGNOSIS — R42 Dizziness and giddiness: Secondary | ICD-10-CM | POA: Diagnosis not present

## 2018-09-22 MED ORDER — ESCITALOPRAM OXALATE 10 MG PO TABS: 10.0000 mg | ORAL_TABLET | Freq: Every day | ORAL | 3 refills | Status: DC

## 2018-09-22 NOTE — Patient Instructions (Signed)
Please return in March 2020 for your annual complete physical; please come fasting and to recheck your anxiety.   If you have any questions or concerns, please don't hesitate to send me a message via MyChart or call the office at [phone]. Thank you for visiting with Krystal Rose today! It's our pleasure caring for you.

## 2018-09-22 NOTE — Progress Notes (Signed)
Subjective  CC:  Chief Complaint  Patient presents with  . Panic Attack    She reports since Lexapro she has had 5 episodes from12/13-12/14    HPI: Krystal Rose is a 52 y.o. female who presents to the office today to address the problems listed above in the chief complaint, mood problems.  Doing really well on lexapro "when not having "episodes"". lexapro is helping with worry and anxiety. As well, went 5 weeks without an episode. Then, had several Mild attacks one weekend. No problems since. Has mild anxiety about having "another episode" but hopeful the lexapro will continue to help.   Depression screen [facility] 2/9 12/27/2017 12/23/2017  Decreased Interest 0 0  Down, Depressed, Hopeless 0 0  PHQ - 2 Score 0 0  Altered sleeping 1 0  Tired, decreased energy 1 0  Change in appetite 0 0  Feeling bad or failure about yourself  0 0  Trouble concentrating 0 0  Moving slowly or fidgety/restless 0 0  Suicidal thoughts 0 0  PHQ-9 Score 2 0  Difficult doing work/chores Not difficult at all -    Assessment  1. Panic attack   2. Spell of altered consciousness   3. Lightheadedness      Plan   Panic attack/anxiety: much improved on lexapro. Continue meds. Monitor for "episodes". If persist or increase again in frequency, recommend follow up with neurology again to consider trial of antieleptic. Pt agrees. Recheck in 3 months.   Follow up: Return in about 3 months (around 12/22/2018) for complete physical, mood follow up.  No orders of the defined types were placed in this encounter.  Meds ordered this encounter  Medications  . escitalopram (LEXAPRO) 10 MG tablet    Sig: Take 1 tablet (10 mg total) by mouth daily.    Dispense:  90 tablet    Refill:  3      I reviewed the patients updated PMH, FH, and SocHx.    Patient Active Problem List   Diagnosis Date Noted  . Spell of altered consciousness 09/22/2018  . Panic attack 09/22/2018  . Benign colon polyp 01/06/2018  .  Headache disorder 12/23/2017  . Premature menopause 12/23/2017  . Dyspareunia in female 12/23/2017   Current Meds  Medication Sig  . ALPRAZolam (XANAX) 0.5 MG tablet Take 1 tablet (0.5 mg total) by mouth daily as needed for anxiety (panic).  Marland Kitchen escitalopram (LEXAPRO) 10 MG tablet Take 1 tablet (10 mg total) by mouth daily.  Marland Kitchen ibuprofen (ADVIL,MOTRIN) 200 MG tablet Take 400 mg by mouth every 8 (eight) hours as needed.  . tretinoin (RETIN-A) 0.01 % gel Apply topically at bedtime.  . [DISCONTINUED] escitalopram (LEXAPRO) 10 MG tablet Take 1 tablet (10 mg total) by mouth daily.    Allergies: Patient is allergic to elastic bandages & [zinc] and latex. Family history:  Patient family history includes Arthritis in her mother; Asthma in her paternal grandfather; COPD in her paternal grandfather; Cancer in her paternal grandfather; Diabetes in her paternal grandfather; Drug abuse in her paternal grandfather; Healthy in her brother, daughter, daughter, and father; Hearing loss in her maternal grandmother and mother; Heart attack in her maternal grandmother; Heart disease in her maternal grandmother; Heart disease (age of onset: 82) in her maternal uncle; Hyperlipidemia in her mother; Hypertension in her mother; Rheum arthritis in her mother; Stomach cancer in her maternal grandfather. Social History   Socioeconomic History  . Marital status: Married    Spouse name: Not on  file  . Number of children: 2  . Years of education: 7  . Highest education level: Not on file  Occupational History  . Occupation: Nurse, children's, vestibular    Comment: works at [employer] 2 days per week  Social Needs  . Financial resource strain: Not on file  . Food insecurity:    Worry: Not on file    Inability: Not on file  . Transportation needs:    Medical: Not on file    Non-medical: Not on file  Tobacco Use  . Smoking status: Never Smoker  . Smokeless tobacco: Never Used  Substance and Sexual Activity  .  Alcohol use: Yes    Alcohol/week: 0.5 standard drinks    Types: 1 Standard drinks or equivalent per week    Comment: occasionally  . Drug use: No  . Sexual activity: Yes    Partners: Male  Lifestyle  . Physical activity:    Days per week: Not on file    Minutes per session: Not on file  . Stress: Not on file  Relationships  . Social connections:    Talks on phone: Not on file    Gets together: Not on file    Attends religious service: Not on file    Active member of club or organization: Not on file    Attends meetings of clubs or organizations: Not on file    Relationship status: Not on file  Other Topics Concern  . 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  Married '87. 2 dtrs - '96, '98. 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 Marriage in good health. No history of physical or sexual abuse.      Review of Systems: Constitutional: Negative for fever malaise or anorexia Cardiovascular: negative for chest pain Respiratory: negative for SOB or persistent cough Gastrointestinal: negative for abdominal pain  Objective  Vitals: BP 116/76   Pulse 67   Temp 98.5 F (36.9 C) (Oral)   Resp 16   Ht 5\' 8"  (1.727 m)   Wt 148 lb 3.2 oz (67.2 kg)   SpO2 98%   BMI 22.53 kg/m  General: no acute distress, well appearing, no apparent distress, well groomed Psych:  Alert and oriented x 3,normal mood, behavior, speech, dress, and thought processes.      Commons side effects, risks, benefits, and alternatives for medications and treatment plan prescribed today were discussed, and the patient expressed understanding of the given instructions. Patient is instructed to call or message via MyChart if he/she has any questions or concerns regarding our treatment plan. No barriers to understanding were identified. We discussed Red Flag symptoms and signs in detail. Patient expressed understanding regarding what to do in case of urgent or  emergency type symptoms.   Medication list was reconciled, printed and provided to the patient in AVS. Patient instructions and summary information was reviewed with the patient as documented in the AVS. This note was prepared with assistance of Dragon voice recognition software. Occasional wrong-word or sound-a-like substitutions may have occurred due to the inherent limitations of voice recognition software

## 2018-11-06 ENCOUNTER — Other Ambulatory Visit: Payer: Self-pay | Admitting: Family Medicine

## 2018-11-06 NOTE — Telephone Encounter (Signed)
Last OV 09/22/18

## 2018-11-15 ENCOUNTER — Other Ambulatory Visit: Payer: Self-pay | Admitting: *Deleted

## 2018-11-15 NOTE — Telephone Encounter (Signed)
[facility] did not receive December refill request

## 2018-11-16 MED ORDER — ESCITALOPRAM OXALATE 10 MG PO TABS: 10.0000 mg | ORAL_TABLET | Freq: Every day | ORAL | 3 refills | Status: DC

## 2018-12-29 ENCOUNTER — Encounter: Payer: 59 | Admitting: Family Medicine

## 2019-03-12 IMAGING — MR MR HEAD WO/W CM
14 of 16 series · 40 of 48 positions shown · IV contrast (multihance)
Comparison: None.

CLINICAL DATA: 52-year-old female with spells of altered
consciousness. Onset in 1986 of recurrent stereotyped episodes of
deja vu with epigastric sensation, some of them waking her up from
sleep. Normal neurologic exam and normal 1 hour awake and asleep
EEG.

EXAM:
MRI HEAD WITHOUT AND WITH CONTRAST
TECHNIQUE: Multiplanar, multiecho pulse sequences of the brain and surrounding
structures were obtained without and with intravenous contrast.
CONTRAST:  15mL MULTIHANCE GADOBENATE DIMEGLUMINE 529 MG/ML IV SOLN

[Series 5: ax dwi_tracew · axial · 3.0mm · 1.50mm/px · z∈[-106,+29]mm · 7 of 80 slices shown]
[im 1/80]
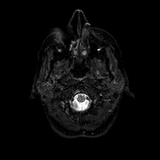
[im 14/80]
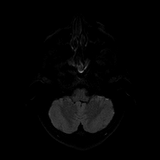
[im 27/80]
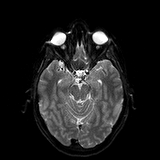
[im 40/80]
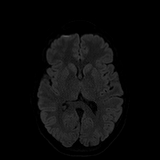
[im 53/80]
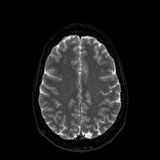
[im 66/80]
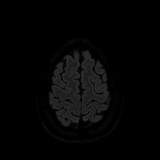
[im 80/80]
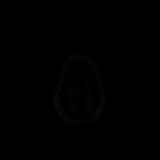

[Series 6: ax dwi_adc · axial · 3.0mm · 1.50mm/px · z∈[-106,+29]mm · 3 of 40 slices shown]
[im 1/40]
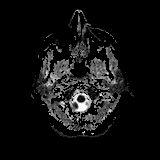
[im 20/40]
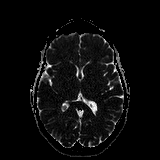
[im 40/40]
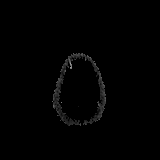

[Series 7: cor dwi_tracew · coronal · 5.0mm · 1.44mm/px · 5 of 64 slices shown]
[im 1/64]
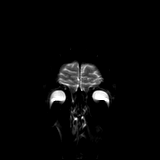
[im 16/64]
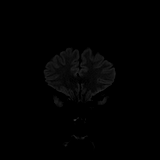
[im 32/64]
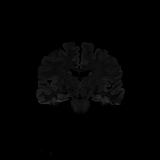
[im 48/64]
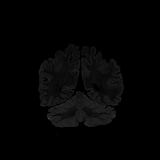
[im 64/64]
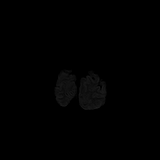

[Series 8: cor dwi_adc · coronal · 5.0mm · 1.44mm/px · 2 of 32 slices shown]
[im 1/32]
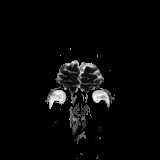
[im 32/32]
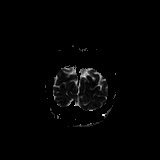

[Series 9: T1 · sagittal · 5.0mm · 0.75mm/px · 2 of 23 slices shown]
[im 1/23]
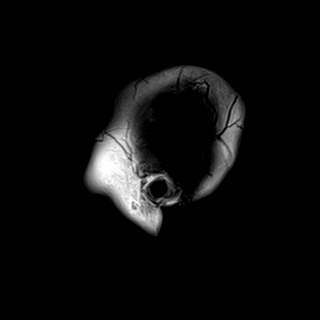
[im 23/23]
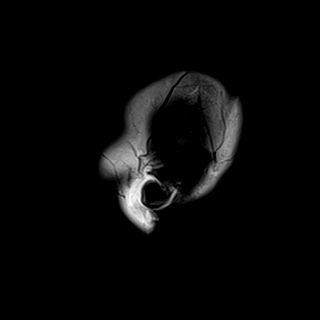

[Series 10: T2 · axial · 5.0mm · 0.72mm/px · z∈[-106,+32]mm · 2 of 25 slices shown (1 of 2)]
[im 1/25]
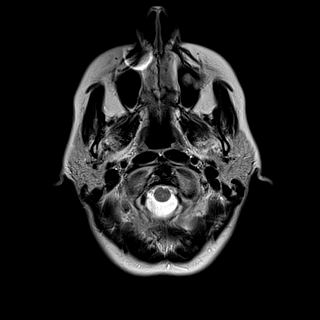
[im 25/25]
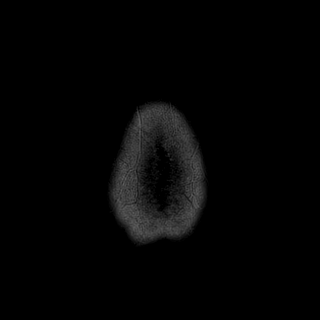

[Series 11: FLAIR · axial · 5.0mm · 0.45mm/px · z∈[-105,+33]mm · 2 of 25 slices shown (1 of 2)]
[im 1/25]
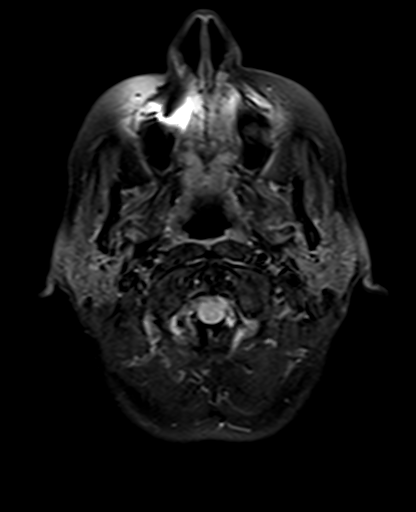
[im 25/25]
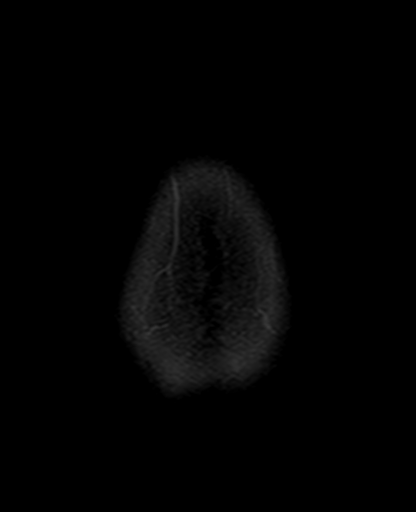

[Series 12: swi_images · axial · 3.0mm · 0.90mm/px · z∈[-104,+43]mm · 4 of 52 slices shown]
[im 1/52]
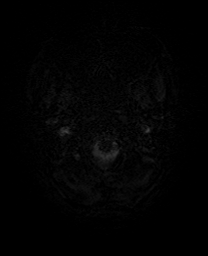
[im 18/52]
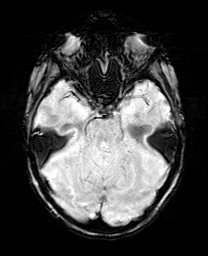
[im 35/52]
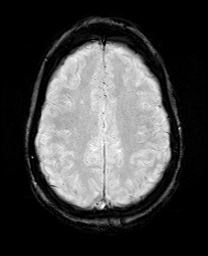
[im 52/52]
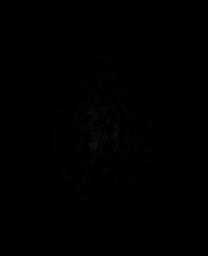

[Series 13: mip_images(sw) · axial · 24.0mm · 0.90mm/px · z∈[-94,+33]mm · 3 of 45 slices shown]
[im 1/45]
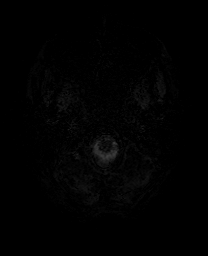
[im 23/45]
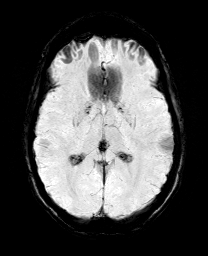
[im 45/45]
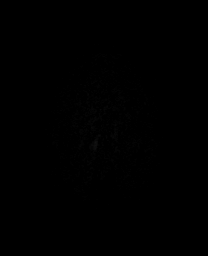

[Series 16: FLAIR · coronal · 3.0mm · 0.56mm/px · 2 of 32 slices shown (2 of 2)]
[im 1/32]
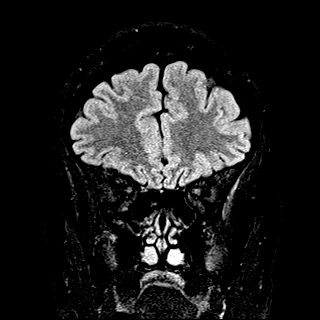
[im 32/32]
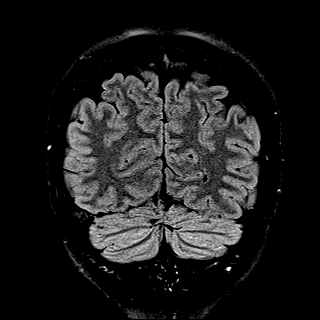

[Series 17: T2 · coronal · 3.0mm · 0.27mm/px · 2 of 32 slices shown (2 of 2)]
[im 1/32]
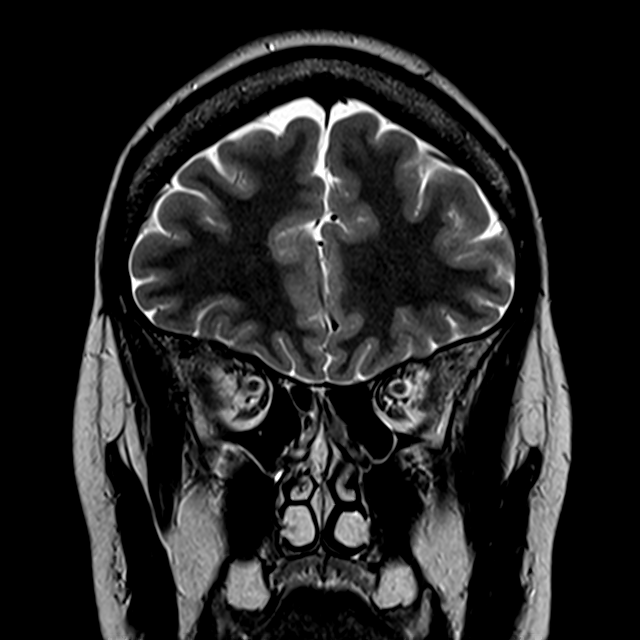
[im 32/32]
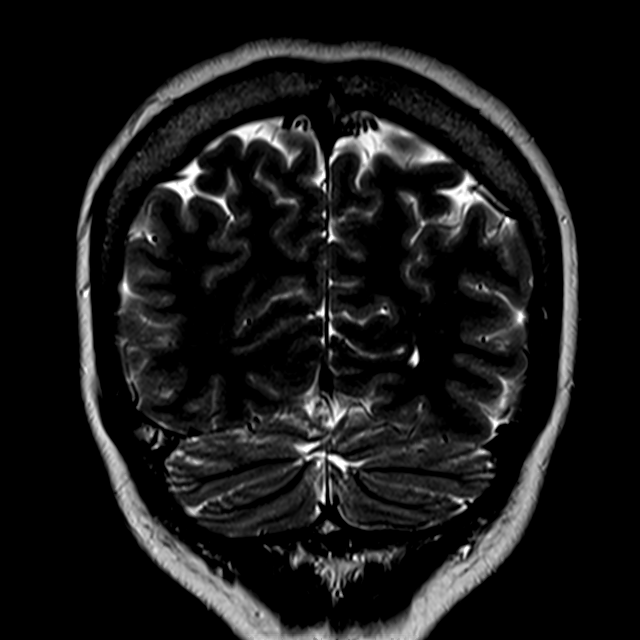

[Series 18: T2 post-contrast · coronal · 5.0mm · 0.72mm/px · 2 of 28 slices shown]
[im 1/28]
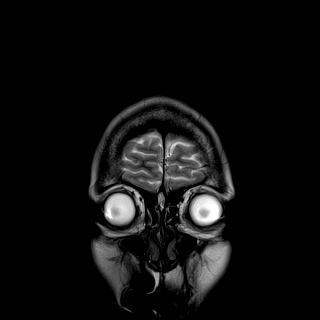
[im 28/28]
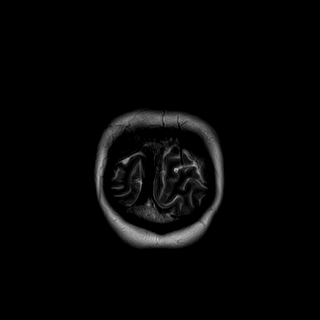

[Series 20: T1 post-contrast · coronal · 5.0mm · 0.34mm/px · 2 of 28 slices shown (1 of 2)]
[im 1/28]
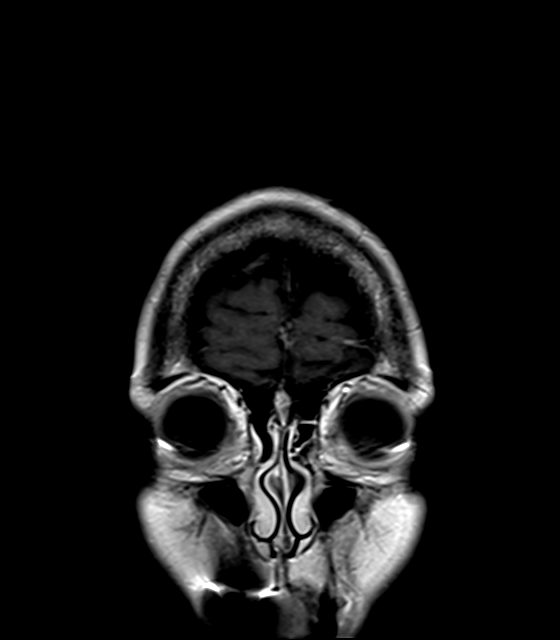
[im 28/28]
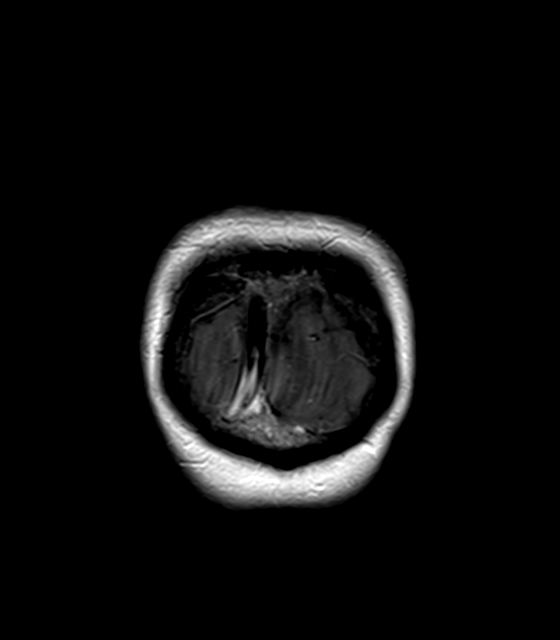

[Series 21: T1 post-contrast · sagittal · 5.0mm · 0.72mm/px · 2 of 23 slices shown (2 of 2)]
[im 1/23]
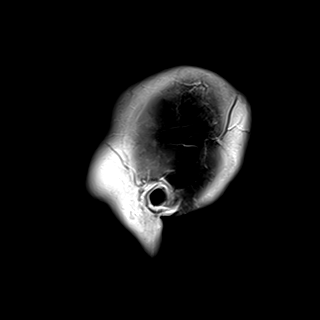
[im 23/23]
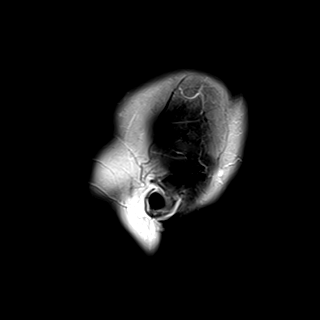

[40 of 48 positions shown; findings below may reference images not displayed]

FINDINGS: Brain: Partially empty sella. Cerebral volume appears normal for
age. No restricted diffusion to suggest acute infarction. No midline
shift, mass effect, evidence of mass lesion, ventriculomegaly,
extra-axial collection or acute intracranial hemorrhage.
Cervicomedullary junction within normal limits.

Thin slice coronal T2 and FLAIR imaging of the temporal lobes
reveals symmetric appearance of the hippocampal volume, signal, and
architecture.
The remaining temporal lobe structures appear normal. Elsewhere gray
and white matter signal is normal for age throughout the brain. No
cortical encephalomalacia. No chronic cerebral blood products. No
heterotopia or migrational abnormality. No abnormal enhancement
identified. No dural thickening.

Vascular: Major intracranial vascular flow voids are preserved and
appear normal. The major dural venous sinuses are enhancing and
appear patent.

Skull and upper cervical spine: Normal visible cervical spine.
Normal bone marrow signal.

Sinuses/Orbits: Normal optic chiasm and orbits soft tissues. The
paranasal sinuses are clear.

Other: The mastoid air cells are clear. Visible internal auditory
structures appear normal. Scalp and face soft tissues appear
negative.
IMPRESSION: Normal MRI appearance of the brain aside from partially empty sella.

## 2019-03-23 ENCOUNTER — Telehealth: Payer: Self-pay | Admitting: Neurology

## 2019-03-23 NOTE — Telephone Encounter (Signed)
Pt brought dropped off envelope of medical records Delice Lesch needs for virtual appt on 03/26/19. Placed records in [place] box.

## 2019-03-26 ENCOUNTER — Telehealth (INDEPENDENT_AMBULATORY_CARE_PROVIDER_SITE_OTHER): Payer: 59 | Admitting: Neurology

## 2019-03-26 ENCOUNTER — Encounter

## 2019-03-26 ENCOUNTER — Other Ambulatory Visit: Payer: Self-pay

## 2019-03-26 VITALS — Ht 68.0 in | Wt 147.0 lb

## 2019-03-26 DIAGNOSIS — G40009 Localization-related (focal) (partial) idiopathic epilepsy and epileptic syndromes with seizures of localized onset, not intractable, without status epilepticus: Secondary | ICD-10-CM

## 2019-03-26 MED ORDER — OXCARBAZEPINE 300 MG PO TABS: ORAL_TABLET | ORAL | 6 refills | Status: DC

## 2019-03-26 NOTE — Progress Notes (Signed)
Virtual Visit via Video Note The purpose of this virtual visit is to provide medical care while limiting exposure to the novel coronavirus.    Consent was obtained for video visit:  Yes.   Answered questions that patient had about telehealth interaction:  Yes.   I discussed the limitations, risks, security and privacy concerns of performing an evaluation and management service by telemedicine. I also discussed with the patient that there may be a patient responsible charge related to this service. The patient expressed understanding and agreed to proceed.  Pt location: Home Physician Location: office Name of referring provider:  Leamon Arnt, MD I connected with Ivette Loyal at patients initiation/request on 03/26/2019 at 10:00 AM EDT by video enabled telemedicine application and verified that I am speaking with the correct person using two identifiers. Pt MRN:  [id number] Pt DOB:  [birth year] Video Participants:  Ivette Loyal   History of Present Illness:  The patient was last seen almost a year ago for recurrent episodes of deja vu with associated epigastric sensation that started in 2018. 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 She had a routine EEG which was normal, her 48-hour EEG in 03/2018 showed rare left temporal focal slowing, no epileptiform discharges. She had 2 episodes where she had the epigastric sensation with no EEG changes seen. We had discussed simple partial seizures and starting seizure medication, she opted to hold off and keep a symptom calendar. Symptom calendar over the past year was reviewed, she had a cluster for 3 days early August 2019, then a cluster several times in one day on 05/23/18, 06/19/18, 07/11/18, 3 days early 07/2018. She started Lexapro in 07/2018 and prn Xanax. She had a cluster  on 09/08/18, 3 days mid-January 2020, 11/10/18, 12/11/18, 01/13/19. She would have brief mild ones during the month, and did well for the month of May. She had another cluster on 03/22/19. She would feel sleepy after. When she was in [facility] in November, episodes were pretty intense that she became disoriented and could not focus or remember things. With the most recent episode, she was at work and could not function. She would feel a "hangover" after these clusters, which is when she feels the worse, like she is "dark, distant, insecure, and sad." Then she would start feeling great like a switch was turned off. She rarely takes the Xanax, usually 1/2 tab for 2-3 days in a month. She had an episode 3 weeks ago while driving home, she started having a bad headache on one side, took Ibuprofen and stopped at a store for ice cream. Headache was really bad, then everything went to the right as she was holding on to the ice cream machine, it was a split second, then the headache and balance issue stopped. No associated deja vu or epigastric sensation. She does not frequently have headaches with the epigastric/deja vu episodes.   History on Initial Assessment 03/27/2018: This is a very pleasant 53 year old right-handed woman with no significant past medical history, in her usual state of health until around October 2018 when she started having recurrent episodes of deja vu with associated epigastric sensation. 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 This was followed by  a terrible sinking in her stomach like a drop on a roller coaster, then a wave of deep despair. Symptoms lasted 30 seconds or so, but recalls the sensation as feeling "horrible and painful." She had 2-3 of these over the next month or so, all occurring upon awakening. For a month she was symptom-free, then symptoms recurred occurring every 1-2 weeks,  sometimes in clusters of 3-4 in 2 weeks. She described some of them, in February she had 4 in one night. In March, she had 3 episodes. With one of them, she had 5 in one day, one with associated lightheadedness like she would faint, feeling tingly in her face, then another where she felt tingling all over. The next day, she had a milder episode while in bed, again with some kind of memory like a classroom/library. In May she had 2 episodes in one day upon waking, she felt cool/tingly around the lips. She had 4 episodes in June, with one of them she had a familiar memory of a person who she was not sure if she knew, then her stomach felt upset with nausea and lip tingling. The next day she had 3 milder episodes early morning, waking up up from sleep. She had 2 more that day lasting 1-2 minutes. The last episode occurred on 03/24/18, it was different because she was looking at a specific accent table picture online and felt the same sensation, she tried to scroll through other tables, then came back to the same table and again had the sensation. There is no associated olfactory or gustatory hallucination except for one time when she had several in one night and smelled lemons in their bedroom. No associated focal weakness or post-event fatigue. No staring/unresponsive episodes, gaps in time, or myoclonic jerks. Sometimes she feels a sensation that it is going to happen, like if she looks to the right side it may occur. No clear triggers, maybe sleep. She gets an average of 6-7 hours of sleep. She has had headaches recently, but unrelated to these. She has headaches around once a week, either on the right hemisphere or the top of her head, no associated nausea/vomiting/phono/photophobia, with good response to Excedrin migraine. She feels the vision on her right eye varies, she had an eye exam with no significant abnormality reported. She recalls one episode 2 weeks ago while drinking 1 glass of martini when she felt she  was slurring her speech for an hour, no other associated symptoms. She had pain in her shoulder blades 11 months ago and could not move for 3 days, this occurred at the peak of her stress when her daughter went to start [employer]. She reports that all of the above symptoms started around the time of stress with her daughter.   Epilepsy Risk Factors:  She had a normal birth and early development.  There is no history of febrile convulsions, CNS infections such as meningitis/encephalitis, significant traumatic brain injury, neurosurgical procedures, or family history of seizures.  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  She had a routine EEG which was normal, her 48-hour EEG in 03/2018 showed rare left temporal focal slowing, no epileptiform discharges. She had 2 episodes where she had the epigastric sensation with no EEG changes seen.   Current Outpatient Medications on File Prior to Visit  Medication Sig Dispense Refill  ALPRAZolam (XANAX) 0.5 MG tablet Take 1 tablet (0.5 mg total) by mouth daily as needed for anxiety (panic). 30 tablet 0   escitalopram (LEXAPRO) 10 MG tablet Take 1 tablet (10 mg total) by mouth daily. 90 tablet 3   tretinoin (RETIN-A) 0.01 % gel Apply topically at bedtime.     ibuprofen (ADVIL,MOTRIN) 200 MG tablet Take 400 mg by mouth every 8 (eight) hours as needed.     No current facility-administered medications on file prior to visit.      Observations/Objective:   Vitals:   03/26/19 0837  Weight: 147 lb (66.7 kg)  Height: 5\' 8"  (1.727 m)   GEN:  The patient appears stated age and is in NAD.  Neurological examination: Patient is awake, alert, oriented x 3. No aphasia or dysarthria. Intact fluency and comprehension. Remote and recent memory intact. Able to name and repeat. Cranial nerves:  Extraocular movements intact with no nystagmus. No facial asymmetry. Motor: moves all extremities symmetrically, at least anti-gravity x 4.   Assessment and Plan:   This is a very pleasant 53 yo RH with recurrent stereotyped episodes of deja vu with epigastric sensation since 2018, some of them waking her up from sleep. MRI brain unremarkable, her 48-hour EEG had shown rare left temporal slowing, no epileptiform discharges. There were 2 episodes of epigastric sensation with no EEG correlate. We had an extensive discussion about the stereotyped nature of her symptoms and the disorientation with the more intense episodes last November 2019, concerning for simple partial seizures. We discussed starting a seizure medication, she is hesitant but is agreeable to slowly starting low dose oxcarbazepine 150mg  qhs x 1 week, then increase to 150mg  BID. Side effects discussed, she will call our office for an update in 2-3 weeks and we will uptitrate as tolerated. We again discussed [place] driving laws to stop driving if she starts having loss of awareness/consciousness. She will follow-up in 3-4 months and knows to call for any changes.    Follow Up Instructions:   -I discussed the assessment and treatment plan with the patient. The patient was provided an opportunity to ask questions and all were answered. The patient agreed with the plan and demonstrated an understanding of the instructions.   The patient was advised to call back or seek an in-person evaluation if the symptoms worsen or if the condition fails to improve as anticipated.   Cameron Sprang, MD

## 2019-04-29 ENCOUNTER — Encounter: Payer: Self-pay | Admitting: Family Medicine

## 2019-04-30 MED ORDER — ALPRAZOLAM 0.5 MG PO TABS: 0.5000 mg | ORAL_TABLET | Freq: Every day | ORAL | 0 refills | Status: DC | PRN

## 2019-05-25 LAB — HM MAMMOGRAPHY: HM Mammogram: NORMAL (ref 0–4)

## 2019-06-07 ENCOUNTER — Other Ambulatory Visit: Payer: Self-pay | Admitting: Neurology

## 2019-06-07 MED ORDER — OXCARBAZEPINE 300 MG PO TABS: ORAL_TABLET | ORAL | 11 refills | Status: DC

## 2019-07-02 ENCOUNTER — Other Ambulatory Visit: Payer: Self-pay

## 2019-07-02 ENCOUNTER — Encounter: Payer: Self-pay | Admitting: Neurology

## 2019-07-02 ENCOUNTER — Ambulatory Visit: Payer: 59 | Admitting: Neurology

## 2019-07-02 VITALS — BP 137/75 | HR 76 | Ht 68.0 in | Wt 155.1 lb

## 2019-07-02 DIAGNOSIS — R253 Fasciculation: Secondary | ICD-10-CM | POA: Diagnosis not present

## 2019-07-02 DIAGNOSIS — G40009 Localization-related (focal) (partial) idiopathic epilepsy and epileptic syndromes with seizures of localized onset, not intractable, without status epilepticus: Secondary | ICD-10-CM

## 2019-07-02 MED ORDER — OXCARBAZEPINE 300 MG PO TABS: ORAL_TABLET | ORAL | 3 refills | Status: DC

## 2019-07-02 NOTE — Patient Instructions (Addendum)
1. Bloodwork for CMP, calcium, magnesium, iron, ferritin, TIBC  2. Increase oxcarbazepine 300mg : Take 1 tab in AM, 1.5 tabs in PM for a week, then increase to 1.5 tabs twice a day  3. Continue seizure calendar  4. Follow-up in 3 months, follow-up for any changes  Seizure Precautions: 1. If medication has been prescribed for you to prevent seizures, take it exactly as directed.  Do not stop taking the medicine without talking to your doctor first, even if you have not had a seizure in a long time.   2. Avoid activities in which a seizure would cause danger to yourself or to others.  Don't operate dangerous machinery, swim alone, or climb in high or dangerous places, such as on ladders, roofs, or girders.  Do not drive unless your doctor says you may.  3. If you have any warning that you may have a seizure, lay down in a safe place where you can't hurt yourself.    4.  No driving for 6 months from last seizure, as per [facility].   Please refer to the following link on the [place] website for more information: http://[email or web address].org/answerplace/Social/driving/drivingu.cfm   5.  Maintain good sleep hygiene. Avoid alcohol.  6.  Contact your doctor if you have any problems that may be related to the medicine you are taking.  7.  Call 911 and bring the patient back to the ED if:        A.  The seizure lasts longer than 5 minutes.       B.  The patient doesn't awaken shortly after the seizure  C.  The patient has new problems such as difficulty seeing, speaking or moving  D.  The patient was injured during the seizure  E.  The patient has a temperature over 102 F (39C)  F.  The patient vomited and now is having trouble breathing
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
## 2019-07-04 ENCOUNTER — Encounter: Payer: Self-pay | Admitting: Neurology

## 2019-08-13 MED ORDER — OXCARBAZEPINE 300 MG PO TABS: ORAL_TABLET | ORAL | 3 refills | Status: DC

## 2019-10-08 ENCOUNTER — Encounter: Payer: Self-pay | Admitting: Neurology

## 2019-10-08 ENCOUNTER — Other Ambulatory Visit: Payer: Self-pay

## 2019-10-08 ENCOUNTER — Telehealth (INDEPENDENT_AMBULATORY_CARE_PROVIDER_SITE_OTHER): Payer: 59 | Admitting: Neurology

## 2019-10-08 VITALS — Ht 68.0 in | Wt 141.0 lb

## 2019-10-08 DIAGNOSIS — G40009 Localization-related (focal) (partial) idiopathic epilepsy and epileptic syndromes with seizures of localized onset, not intractable, without status epilepticus: Secondary | ICD-10-CM | POA: Diagnosis not present

## 2019-10-08 MED ORDER — OXCARBAZEPINE 300 MG PO TABS: ORAL_TABLET | ORAL | 3 refills | Status: DC

## 2019-10-08 NOTE — Progress Notes (Signed)
Virtual Visit via Video Note The purpose of this virtual visit is to provide medical care while limiting exposure to the novel coronavirus.    Consent was obtained for video visit:  Yes.   Answered questions that patient had about telehealth interaction:  Yes.   I discussed the limitations, risks, security and privacy concerns of performing an evaluation and management service by telemedicine. I also discussed with the patient that there may be a patient responsible charge related to this service. The patient expressed understanding and agreed to proceed.  Pt location: Home Physician Location: office Name of referring provider:  Leamon Arnt, MD I connected with Krystal Rose at patients initiation/request on 10/08/2019 at  2:30 PM EST by video enabled telemedicine application and verified that I am speaking with the correct person using two identifiers. Pt MRN:  [id number] Pt DOB:  [birth year] Video Participants:  Krystal Rose   History of Present Illness: The patient was seen as a virtual video visit on 10/08/2019. She was last seen 3 months ago in the neurology clinic for seizures. Since her last visit, her seizure diary was reviewed. She has had a significant reduction in seizures with initiation of oxcarbazepine. In October, she had mild nocturnal episodes that woke her up from 11:30pm to 3am with mild stomach symptoms and deja vu. Oxcarbazepine dose increased to 600mg  BID. She had a mild deja vu episode and did not have the hangover sensation. She had side effects of double vision, dizziness, and cognitive changes on 600mg  BID and reduced dose to 450mg  in AM, 600mg  in PM on 12/8. On 12/31 she was sleepy all day with a headache. She dozed on and off, went to bed early for New Years eve, no alcohol. That night she kept having a rolling episode off and on with epigastric sensation, right hand was very hot. She woke up with a mild headache but no hangover. She has been tolerating her  current dose of oxcarbazepine much better. She denies any significant headaches, no further dizziness but states her balance is horrible. No falls.    History on Initial Assessment 03/27/2018: This is a very pleasant 54 year old right-handed woman with no significant past medical history, in her usual state of health until around October 2018 when she started having recurrent episodes of deja vu with associated epigastric sensation. 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 This was followed by a terrible sinking in her stomach like a drop on a roller coaster, then a wave of deep despair. Symptoms lasted 30 seconds or so, but recalls the sensation as feeling "horrible and painful." She had 2-3 of these over the next month or so, all occurring upon awakening. For a month she was symptom-free, then symptoms recurred occurring every 1-2 weeks, sometimes in clusters of 3-4 in 2 weeks. She described some of them, in February she had 4 in one night. In March, she had 3 episodes. With one of them, she had 5 in one day, one with associated lightheadedness like she would faint, feeling tingly in her face, then another where she felt tingling all over. The next day, she had a milder episode while in bed, again with some kind of memory like a classroom/library. In May she had 2 episodes in one day upon waking, she felt cool/tingly around the lips. She had 4 episodes in June, with  one of them she had a familiar memory of a person who she was not sure if she knew, then her stomach felt upset with nausea and lip tingling. The next day she had 3 milder episodes early morning, waking up up from sleep. She had 2 more that day lasting 1-2 minutes. The last episode occurred on 03/24/18, it was different because she was looking at a specific accent table picture online and felt the same sensation, she tried to scroll through other  tables, then came back to the same table and again had the sensation. There is no associated olfactory or gustatory hallucination except for one time when she had several in one night and smelled lemons in their bedroom. No associated focal weakness or post-event fatigue. No staring/unresponsive episodes, gaps in time, or myoclonic jerks. Sometimes she feels a sensation that it is going to happen, like if she looks to the right side it may occur. No clear triggers, maybe sleep. She gets an average of 6-7 hours of sleep. She has had headaches recently, but unrelated to these. She has headaches around once a week, either on the right hemisphere or the top of her head, no associated nausea/vomiting/phono/photophobia, with good response to Excedrin migraine. She feels the vision on her right eye varies, she had an eye exam with no significant abnormality reported. She recalls one episode 2 weeks ago while drinking 1 glass of martini when she felt she was slurring her speech for an hour, no other associated symptoms. She had pain in her shoulder blades 11 months ago and could not move for 3 days, this occurred at the peak of her stress when her daughter went to start [employer]. She reports that all of the above symptoms started around the time of stress with her daughter.   Epilepsy Risk Factors:  She had a normal birth and early development.  There is no history of febrile convulsions, CNS infections such as meningitis/encephalitis, significant traumatic brain injury, neurosurgical procedures, or family history of seizures.  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  She had a routine EEG which was normal, her 48-hour EEG in 03/2018 showed rare left temporal focal slowing, no epileptiform discharges. She had 2 episodes where she had the epigastric  sensation with no EEG changes seen.    Current Outpatient Medications on File Prior to Visit  Medication Sig Dispense Refill  . ALPRAZolam (XANAX) 0.5 MG tablet Take 1 tablet (0.5 mg total) by mouth daily as needed for anxiety (panic). 20 tablet 0  . escitalopram (LEXAPRO) 10 MG tablet Take 1 tablet (10 mg total) by mouth daily. 90 tablet 3  . tretinoin (RETIN-A) 0.01 % gel Apply topically at bedtime.     No current facility-administered medications on file prior to visit.     Observations/Objective:   Vitals:   10/08/19 0857  Weight: 141 lb (64 kg)  Height: 5\' 8"  (1.727 m)   GEN:  The patient appears stated age and is in NAD.  Neurological examination: Patient is awake, alert, oriented x 3. No aphasia or dysarthria. Intact fluency and comprehension. Remote and recent memory intact. Able to name and repeat. Cranial nerves: Extraocular movements intact with no nystagmus. No facial asymmetry. Motor: moves all extremities symmetrically, at least anti-gravity x 4.    Assessment and Plan:   This is a very  pleasant 54 yo RH with recurrent stereotyped episodes of deja vu with epigastric sensation since 2018, some of them waking her up from sleep. MRI brain unremarkable, her 48-hour EEG had shown rare left temporal slowing, no epileptiform discharges. There were 2 episodes of epigastric sensation with no EEG correlate. She continues to report good response to oxcarbazepine, although with side effects on 600mg  BID dose. We have agreed to monitor symptoms/seizure triggers on oxcarbazepine 450mg  in AM, 600mg  in PM. She is aware of [place] driving laws to stop driving if she starts having loss of awareness/consciousness. She will follow-up in 3-4 months and knows to call for any changes.    Follow Up Instructions:    -I discussed the assessment and treatment plan with the patient. The patient was provided an opportunity to ask questions and all were answered. The patient agreed with the plan and  demonstrated an understanding of the instructions.   The patient was advised to call back or seek an in-person evaluation if the symptoms worsen or if the condition fails to improve as anticipated.     Cameron Sprang, MD

## 2019-10-16 MED ORDER — OXCARBAZEPINE 300 MG PO TABS: ORAL_TABLET | ORAL | 3 refills | Status: DC

## 2019-10-26 ENCOUNTER — Other Ambulatory Visit: Payer: Self-pay

## 2019-10-29 ENCOUNTER — Other Ambulatory Visit: Payer: Self-pay

## 2019-10-29 ENCOUNTER — Ambulatory Visit (INDEPENDENT_AMBULATORY_CARE_PROVIDER_SITE_OTHER): Payer: 59 | Admitting: Family Medicine

## 2019-10-29 ENCOUNTER — Encounter: Payer: Self-pay | Admitting: Family Medicine

## 2019-10-29 VITALS — BP 120/78 | HR 68 | Temp 97.7°F | Ht 68.0 in | Wt 138.2 lb

## 2019-10-29 DIAGNOSIS — Z0000 Encounter for general adult medical examination without abnormal findings: Secondary | ICD-10-CM | POA: Diagnosis not present

## 2019-10-29 DIAGNOSIS — F410 Panic disorder [episodic paroxysmal anxiety] without agoraphobia: Secondary | ICD-10-CM | POA: Diagnosis not present

## 2019-10-29 DIAGNOSIS — R569 Unspecified convulsions: Secondary | ICD-10-CM

## 2019-10-29 HISTORY — DX: Unspecified convulsions: R56.9

## 2019-10-29 LAB — CBC WITH DIFFERENTIAL/PLATELET
Basophils Absolute: 0 10*3/uL (ref 0.0–0.1)
Basophils Relative: 1 % (ref 0.0–3.0)
Eosinophils Absolute: 0.1 10*3/uL (ref 0.0–0.7)
Eosinophils Relative: 1.4 % (ref 0.0–5.0)
HCT: 36.7 % (ref 36.0–46.0)
Hemoglobin: 12.6 g/dL (ref 12.0–15.0)
Lymphocytes Relative: 28.1 % (ref 12.0–46.0)
Lymphs Abs: 1.1 10*3/uL (ref 0.7–4.0)
MCHC: 34.3 g/dL (ref 30.0–36.0)
MCV: 92.9 fl (ref 78.0–100.0)
Monocytes Absolute: 0.7 10*3/uL (ref 0.1–1.0)
Monocytes Relative: 18 % — ABNORMAL HIGH (ref 3.0–12.0)
Neutro Abs: 2 10*3/uL (ref 1.4–7.7)
Neutrophils Relative %: 51.5 % (ref 43.0–77.0)
Platelets: 282 10*3/uL (ref 150.0–400.0)
RBC: 3.95 Mil/uL (ref 3.87–5.11)
RDW: 13.3 % (ref 11.5–15.5)
WBC: 3.8 10*3/uL — ABNORMAL LOW (ref 4.0–10.5)

## 2019-10-29 LAB — LIPID PANEL
Cholesterol: 187 mg/dL (ref 0–200)
HDL: 73.2 mg/dL (ref >39.00)
LDL Cholesterol: 105 mg/dL — ABNORMAL HIGH (ref 0–99)
NonHDL: 113.72
Total CHOL/HDL Ratio: 3
Triglycerides: 43 mg/dL (ref 0.0–149.0)
VLDL: 8.6 mg/dL (ref 0.0–40.0)

## 2019-10-29 LAB — COMPREHENSIVE METABOLIC PANEL
ALT: 15 U/L (ref 0–35)
AST: 20 U/L (ref 0–37)
Albumin: 4.6 g/dL (ref 3.5–5.2)
Alkaline Phosphatase: 73 U/L (ref 39–117)
BUN: 12 mg/dL (ref 6–23)
CO2: 28 mEq/L (ref 19–32)
Calcium: 9.6 mg/dL (ref 8.4–10.5)
Chloride: 87 mEq/L — ABNORMAL LOW (ref 96–112)
Creatinine, Ser: 0.62 mg/dL (ref 0.40–1.20)
GFR: 99.72 mL/min (ref >60.00)
Glucose, Bld: 87 mg/dL (ref 70–99)
Potassium: 4.7 mEq/L (ref 3.5–5.1)
Sodium: 123 mEq/L — ABNORMAL LOW (ref 135–145)
Total Bilirubin: 0.3 mg/dL (ref 0.2–1.2)
Total Protein: 7.1 g/dL (ref 6.0–8.3)

## 2019-10-29 LAB — TSH: TSH: 1.51 u[IU]/mL (ref 0.35–4.50)

## 2019-10-29 MED ORDER — ESCITALOPRAM OXALATE 10 MG PO TABS: 10.0000 mg | ORAL_TABLET | Freq: Every day | ORAL | 3 refills | Status: DC

## 2019-10-29 NOTE — Patient Instructions (Addendum)
Please return in 12 months for your annual complete physical; please come fasting. Sooner if needed to discuss panic/mood or anxiety.   I will release your lab results to you on your MyChart account with further instructions. Please reply with any questions.   If you have any questions or concerns, please don't hesitate to send me a message via MyChart or call the office at [phone]. Thank you for visiting with [place] today! It's our pleasure caring for you.   Preventive Care 35-28 Years Old, Female Preventive care refers to visits with your health care provider and lifestyle choices that can promote health and wellness. This includes:  A yearly physical exam. This may also be called an annual well check.  Regular dental visits and eye exams.  Immunizations.  Screening for certain conditions.  Healthy lifestyle choices, such as eating a healthy diet, getting regular exercise, not using drugs or products that contain nicotine and tobacco, and limiting alcohol use. What can I expect for my preventive care visit? Physical exam Your health care provider will check your:  Height and weight. This may be used to calculate body mass index (BMI), which tells if you are at a healthy weight.  Heart rate and blood pressure.  Skin for abnormal spots. Counseling Your health care provider may ask you questions about your:  Alcohol, tobacco, and drug use.  Emotional well-being.  Home and relationship well-being.  Sexual activity.  Eating habits.  Work and work Statistician.  Method of birth control.  Menstrual cycle.  Pregnancy history. What immunizations do I need?  Influenza (flu) vaccine  This is recommended every year. Tetanus, diphtheria, and pertussis (Tdap) vaccine  You may need a Td booster every 10 years. Varicella (chickenpox) vaccine  You may need this if you have not been vaccinated. Zoster (shingles) vaccine  You may need this after age 23. Measles, mumps,  and rubella (MMR) vaccine  You may need at least one dose of MMR if you were born in 1957 or later. You may also need a second dose. Pneumococcal conjugate (PCV13) vaccine  You may need this if you have certain conditions and were not previously vaccinated. Pneumococcal polysaccharide (PPSV23) vaccine  You may need one or two doses if you smoke cigarettes or if you have certain conditions. Meningococcal conjugate (MenACWY) vaccine  You may need this if you have certain conditions. Hepatitis A vaccine  You may need this if you have certain conditions or if you travel or work in places where you may be exposed to hepatitis A. Hepatitis B vaccine  You may need this if you have certain conditions or if you travel or work in places where you may be exposed to hepatitis B. Haemophilus influenzae type b (Hib) vaccine  You may need this if you have certain conditions. Human papillomavirus (HPV) vaccine  If recommended by your health care provider, you may need three doses over 6 months. You may receive vaccines as individual doses or as more than one vaccine together in one shot (combination vaccines). Talk with your health care provider about the risks and benefits of combination vaccines. What tests do I need? Blood tests  Lipid and cholesterol levels. These may be checked every 5 years, or more frequently if you are over 31 years old.  Hepatitis C test.  Hepatitis B test. Screening  Lung cancer screening. You may have this screening every year starting at age 43 if you have a 30-pack-year history of smoking and currently smoke or have  quit within the past 15 years.  Colorectal cancer screening. All adults should have this screening starting at age 52 and continuing until age 24. Your health care provider may recommend screening at age 21 if you are at increased risk. You will have tests every 1-10 years, depending on your results and the type of screening test.  Diabetes screening.  This is done by checking your blood sugar (glucose) after you have not eaten for a while (fasting). You may have this done every 1-3 years.  Mammogram. This may be done every 1-2 years. Talk with your health care provider about when you should start having regular mammograms. This may depend on whether you have a family history of breast cancer.  BRCA-related cancer screening. This may be done if you have a family history of breast, ovarian, tubal, or peritoneal cancers.  Pelvic exam and Pap test. This may be done every 3 years starting at age 27. Starting at age 72, this may be done every 5 years if you have a Pap test in combination with an HPV test. Other tests  Sexually transmitted disease (STD) testing.  Bone density scan. This is done to screen for osteoporosis. You may have this scan if you are at high risk for osteoporosis. Follow these instructions at home: Eating and drinking  Eat a diet that includes fresh fruits and vegetables, whole grains, lean protein, and low-fat dairy.  Take vitamin and mineral supplements as recommended by your health care provider.  Do not drink alcohol if: ? Your health care provider tells you not to drink. ? You are pregnant, may be pregnant, or are planning to become pregnant.  If you drink alcohol: ? Limit how much you have to 0-1 drink a day. ? Be aware of how much alcohol is in your drink. In the [place] one drink equals one 12 oz bottle of beer (355 mL), one 5 oz glass of wine (148 mL), or one 1 oz glass of hard liquor (44 mL). Lifestyle  Take daily care of your teeth and gums.  Stay active. Exercise for at least 30 minutes on 5 or more days each week.  Do not use any products that contain nicotine or tobacco, such as cigarettes, e-cigarettes, and chewing tobacco. If you need help quitting, ask your health care provider.  If you are sexually active, practice safe sex. Use a condom or other form of birth control (contraception) in order to  prevent pregnancy and STIs (sexually transmitted infections).  If told by your health care provider, take low-dose aspirin daily starting at age 37. What's next?  Visit your health care provider once a year for a well check visit.  Ask your health care provider how often you should have your eyes and teeth checked.  Stay up to date on all vaccines. This information is not intended to replace advice given to you by your health care provider. Make sure you discuss any questions you have with your health care provider. Document Revised: 05/25/2018 Document Reviewed: 05/25/2018 Elsevier Patient Education  2020 [employer].
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
## 2019-10-31 NOTE — Progress Notes (Signed)
Please call patient: sodium levels are very low and this may be due to the seizure medication. I have forwarded these results to Dr. Delice Lesch for her review. Dr. Delice Lesch should be contacting the patient with instructions. Her medication may need to be adjusted.  Dr. Delice Lesch, please see the sodium level of 121 and address. Let me know if I can help.  Thanks.  Dr. Jonni Sanger

## 2019-11-01 ENCOUNTER — Other Ambulatory Visit: Payer: Self-pay | Admitting: Neurology

## 2019-11-01 ENCOUNTER — Encounter: Payer: Self-pay | Admitting: *Deleted

## 2019-11-01 ENCOUNTER — Other Ambulatory Visit: Payer: Self-pay

## 2019-11-01 MED ORDER — OXTELLAR XR 600 MG PO TB24: ORAL_TABLET | ORAL | 5 refills | Status: DC

## 2019-11-01 NOTE — Progress Notes (Addendum)
Bess Kinds Key: Huntley Estelle - PA Case ID: [id number] - Rx #: [id number] Need help? Call us at [phone] Outcome Approvedon February 4 PA Case: [id number], Status: Approved, Coverage Starts on: 11/01/2019 12:00:00 AM, Coverage Ends on: 09/26/2020 12:00:00 AM. Questions? Contact [phone]. Drug Oxtellar XR 600MG  er tablets Form Administrator, sports PA Form Original Claim Info MR

## 2019-11-16 ENCOUNTER — Other Ambulatory Visit: Payer: Self-pay

## 2019-11-16 ENCOUNTER — Telehealth: Payer: Self-pay

## 2019-11-16 DIAGNOSIS — E871 Hypo-osmolality and hyponatremia: Secondary | ICD-10-CM

## 2019-11-16 NOTE — Telephone Encounter (Signed)
Pt called informed that Lab work has been ordered she will come by next week to have it done,

## 2019-11-23 ENCOUNTER — Other Ambulatory Visit: Payer: Self-pay

## 2019-11-23 ENCOUNTER — Other Ambulatory Visit (INDEPENDENT_AMBULATORY_CARE_PROVIDER_SITE_OTHER): Payer: 59

## 2019-11-23 DIAGNOSIS — E871 Hypo-osmolality and hyponatremia: Secondary | ICD-10-CM

## 2019-11-23 LAB — BASIC METABOLIC PANEL
BUN: 15 mg/dL (ref 7–25)
CO2: 26 mmol/L (ref 20–32)
Calcium: 9.2 mg/dL (ref 8.6–10.4)
Chloride: 100 mmol/L (ref 98–110)
Creat: 0.63 mg/dL (ref 0.50–1.05)
Glucose, Bld: 108 mg/dL — ABNORMAL HIGH (ref 65–99)
Potassium: 4 mmol/L (ref 3.5–5.3)
Sodium: 133 mmol/L — ABNORMAL LOW (ref 135–146)

## 2019-11-28 ENCOUNTER — Other Ambulatory Visit: Payer: Self-pay

## 2019-11-28 DIAGNOSIS — R569 Unspecified convulsions: Secondary | ICD-10-CM

## 2019-11-28 DIAGNOSIS — G40009 Localization-related (focal) (partial) idiopathic epilepsy and epileptic syndromes with seizures of localized onset, not intractable, without status epilepticus: Secondary | ICD-10-CM

## 2019-11-28 DIAGNOSIS — R29818 Other symptoms and signs involving the nervous system: Secondary | ICD-10-CM

## 2019-12-20 ENCOUNTER — Telehealth: Payer: Self-pay

## 2019-12-20 NOTE — Telephone Encounter (Signed)
Pt called no answer voice mail left that she needs lab work done, order placed in epic.

## 2020-01-07 ENCOUNTER — Other Ambulatory Visit: Payer: 59

## 2020-01-07 ENCOUNTER — Other Ambulatory Visit: Payer: Self-pay

## 2020-01-07 DIAGNOSIS — R569 Unspecified convulsions: Secondary | ICD-10-CM

## 2020-01-07 DIAGNOSIS — G40009 Localization-related (focal) (partial) idiopathic epilepsy and epileptic syndromes with seizures of localized onset, not intractable, without status epilepticus: Secondary | ICD-10-CM

## 2020-01-07 LAB — BASIC METABOLIC PANEL
BUN: 15 mg/dL (ref 7–25)
CO2: 29 mmol/L (ref 20–32)
Calcium: 9.4 mg/dL (ref 8.6–10.4)
Chloride: 94 mmol/L — ABNORMAL LOW (ref 98–110)
Creat: 0.69 mg/dL (ref 0.50–1.05)
Glucose, Bld: 106 mg/dL — ABNORMAL HIGH (ref 65–99)
Potassium: 4.2 mmol/L (ref 3.5–5.3)
Sodium: 131 mmol/L — ABNORMAL LOW (ref 135–146)

## 2020-01-08 ENCOUNTER — Other Ambulatory Visit: Payer: Self-pay

## 2020-01-08 DIAGNOSIS — E871 Hypo-osmolality and hyponatremia: Secondary | ICD-10-CM

## 2020-01-08 DIAGNOSIS — R253 Fasciculation: Secondary | ICD-10-CM

## 2020-01-08 NOTE — Progress Notes (Signed)
Sodium level order placed for next month

## 2020-02-06 ENCOUNTER — Encounter: Payer: Self-pay | Admitting: Neurology

## 2020-02-06 ENCOUNTER — Ambulatory Visit: Payer: 59 | Admitting: Neurology

## 2020-02-06 ENCOUNTER — Other Ambulatory Visit: Payer: Self-pay

## 2020-02-06 ENCOUNTER — Other Ambulatory Visit (INDEPENDENT_AMBULATORY_CARE_PROVIDER_SITE_OTHER): Payer: 59

## 2020-02-06 VITALS — BP 115/75 | HR 78 | Resp 18 | Ht 68.0 in | Wt 142.0 lb

## 2020-02-06 DIAGNOSIS — E871 Hypo-osmolality and hyponatremia: Secondary | ICD-10-CM | POA: Diagnosis not present

## 2020-02-06 DIAGNOSIS — G40009 Localization-related (focal) (partial) idiopathic epilepsy and epileptic syndromes with seizures of localized onset, not intractable, without status epilepticus: Secondary | ICD-10-CM

## 2020-02-06 DIAGNOSIS — R569 Unspecified convulsions: Secondary | ICD-10-CM

## 2020-02-06 DIAGNOSIS — R253 Fasciculation: Secondary | ICD-10-CM

## 2020-02-06 MED ORDER — OXTELLAR XR 600 MG PO TB24: ORAL_TABLET | ORAL | 3 refills | Status: DC

## 2020-02-06 NOTE — Progress Notes (Signed)
NEUROLOGY FOLLOW UP OFFICE NOTE  Krystal Rose [id number] [birth year]  HISTORY OF PRESENT ILLNESS: I had the pleasure of seeing Krystal Rose in follow-up in the neurology clinic on 02/06/2020.  The patient was last seen 4 months ago for seizures. Since her last visit, she was switched to Oxtellar XR last February 2021 due to hyponatremia with Na of 123 (Na 141 in 2019). Repeat Na levels on 2/26 and 4/12 stabilized at between 131-133. She had been doing well for over 9 weeks until she had a cluster of seizures from 3/27 to 4/5 while travelling to [place]. She had one on the plane, that evening, then another nocturnal seizure (8 days/nights), which is the longest duration she has had, however she was happy that they were not as dark and sickening as some of her prior seizures and the hangover was not as intense. She has been under a lot of stress but hopes it will improve soon. She feels really good taking Oxtellar XR 600mg  daily without side effects. She has been on Lexapro over a year and feels it helps. She has had headaches she attributes to her vision and dehydration, she got new glasses 2 hours ago and sees much better. No diplopia, dizziness. She denies any episodes of staring/unresponsiveness. She has a little bit of brain fog which she also attributes to her vision, "brain fatigue." No falls.   History on Initial Assessment 03/27/2018: This is a very pleasant 54 year old right-handed woman with no significant past medical history, in her usual state of health until around October 2018 when she started having recurrent episodes of deja vu with associated epigastric sensation. 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 This was followed by a terrible sinking in her stomach like a drop on a roller coaster, then a wave of deep despair. Symptoms lasted 30 seconds or so, but recalls the sensation as  feeling "horrible and painful." She had 2-3 of these over the next month or so, all occurring upon awakening. For a month she was symptom-free, then symptoms recurred occurring every 1-2 weeks, sometimes in clusters of 3-4 in 2 weeks. She described some of them, in February she had 4 in one night. In March, she had 3 episodes. With one of them, she had 5 in one day, one with associated lightheadedness like she would faint, feeling tingly in her face, then another where she felt tingling all over. The next day, she had a milder episode while in bed, again with some kind of memory like a classroom/library. In May she had 2 episodes in one day upon waking, she felt cool/tingly around the lips. She had 4 episodes in June, with one of them she had a familiar memory of a person who she was not sure if she knew, then her stomach felt upset with nausea and lip tingling. The next day she had 3 milder episodes early morning, waking up up from sleep. She had 2 more that day lasting 1-2 minutes. The last episode occurred on 03/24/18, it was different because she was looking at a specific accent table picture online and felt the same sensation, she tried to scroll through other tables, then came back to the same table and again had the sensation. There is no associated olfactory or gustatory hallucination except for one time when she had several in one night and smelled lemons in  their bedroom. No associated focal weakness or post-event fatigue. No staring/unresponsive episodes, gaps in time, or myoclonic jerks. Sometimes she feels a sensation that it is going to happen, like if she looks to the right side it may occur. No clear triggers, maybe sleep. She gets an average of 6-7 hours of sleep. She has had headaches recently, but unrelated to these. She has headaches around once a week, either on the right hemisphere or the top of her head, no associated nausea/vomiting/phono/photophobia, with good response to Excedrin migraine.  She feels the vision on her right eye varies, she had an eye exam with no significant abnormality reported. She recalls one episode 2 weeks ago while drinking 1 glass of martini when she felt she was slurring her speech for an hour, no other associated symptoms. She had pain in her shoulder blades 11 months ago and could not move for 3 days, this occurred at the peak of her stress when her daughter went to start [employer]. She reports that all of the above symptoms started around the time of stress with her daughter.   Epilepsy Risk Factors:  She had a normal birth and early development.  There is no history of febrile convulsions, CNS infections such as meningitis/encephalitis, significant traumatic brain injury, neurosurgical procedures, or family history of seizures.  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  She had a routine EEG which was normal, her 48-hour EEG in 03/2018 showed rare left temporal focal slowing, no epileptiform discharges. She had 2 episodes where she had the epigastric sensation with no EEG changes seen.  Prior AEDs: oxcarbazepine (hyponatremia, Na 123)  PAST MEDICAL HISTORY: Past Medical History:  Diagnosis Date  . Allergy   . Basal cell carcinoma   . Benign colon polyp 01/06/2018   Colonoscopy 12/2017  . 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 Delice Lesch, neurology; MRI and EEG  . Frequent headaches   . History of whiplash injury to neck '04   recovered  . Menopausal state     MEDICATIONS: Current Outpatient Medications on File Prior to Visit  Medication Sig Dispense Refill  . ALPRAZolam (XANAX) 0.5 MG tablet Take 1 tablet (0.5 mg total) by mouth daily as needed for anxiety (panic). 20 tablet 0  . escitalopram (LEXAPRO) 10 MG tablet Take 1 tablet (10 mg total) by mouth daily. 90 tablet 3  . OXcarbazepine  ER (OXTELLAR XR) 600 MG TB24 Take 2 tablets daily 60 tablet 5  . tretinoin (RETIN-A) 0.01 % gel Apply topically at bedtime.     No current facility-administered medications on file prior to visit.    ALLERGIES: Allergies  Allergen Reactions  . Elastic Bandages & [Zinc]     Elastic in bathing suits and causes contact dermatitis  . Latex     Causes contact dermatitis    FAMILY HISTORY: Family History  Problem Relation Age of Onset  . Hypertension Mother   . Hyperlipidemia Mother   . Rheum arthritis Mother   . Arthritis Mother   . Hearing loss Mother   . Healthy Father   . Diabetes Paternal Grandfather   . COPD Paternal Grandfather   . Asthma Paternal Grandfather   . Cancer Paternal Grandfather   . Drug abuse Paternal Grandfather   . Healthy Brother   . Heart disease Maternal Uncle 32  died of MI  . Heart disease Maternal Grandmother   . Hearing loss Maternal Grandmother   . Heart attack Maternal Grandmother   . Stomach cancer Maternal Grandfather   . Healthy Daughter   . Healthy Daughter   . Colon polyps Neg Hx   . Colon cancer Neg Hx   . Esophageal cancer Neg Hx   . Rectal cancer Neg Hx     SOCIAL HISTORY: Social History   Socioeconomic History  . Marital status: Married    Spouse name: Not on file  . Number of children: 2  . Years of education: 51  . Highest education level: Not on file  Occupational History  . Occupation: Nurse, children's, vestibular    Comment: works at [employer] 2 days per week  Tobacco Use  . Smoking status: Never Smoker  . Smokeless tobacco: Never Used  Substance and Sexual Activity  . Alcohol use: Yes    Alcohol/week: 0.5 standard drinks    Types: 1 Standard drinks or equivalent per week    Comment: occasionally  . Drug use: No  . Sexual activity: Yes    Partners: Male  Other Topics Concern  . 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  Married '87. 2 dtrs - '96, '98. 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 Marriage in good health. No history of physical or sexual abuse.    Social Determinants of Health   Financial Resource Strain:   . Difficulty of Paying Living Expenses:   Food Insecurity:   . Worried About Charity fundraiser in the Last Year:   . Arboriculturist in the Last Year:   Transportation Needs:   . Film/video editor (Medical):   Marland Kitchen Lack of Transportation (Non-Medical):   Physical Activity:   . Days of Exercise per Week:   . Minutes of Exercise per Session:   Stress:   . Feeling of Stress :   Social Connections:   . Frequency of Communication with Friends and Family:   . Frequency of Social Gatherings with Friends and Family:   . Attends Religious Services:   . Active Member of Clubs or Organizations:   . Attends Archivist Meetings:   Marland Kitchen Marital Status:   Intimate Partner Violence:   . Fear of Current or Ex-Partner:   . Emotionally Abused:   Marland Kitchen Physically Abused:   . Sexually Abused:     REVIEW OF SYSTEMS: Constitutional: No fevers, chills, or sweats, no generalized fatigue, change in appetite Eyes: No visual changes, double vision, eye pain Ear, nose and throat: No hearing loss, ear pain, nasal congestion, sore throat Cardiovascular: No chest pain, palpitations Respiratory:  No shortness of breath at rest or with exertion, wheezes GastrointestinaI: No nausea, vomiting, diarrhea, abdominal pain, fecal incontinence Genitourinary:  No dysuria, urinary retention or frequency Musculoskeletal:  No neck pain, back pain Integumentary: No rash, pruritus, skin lesions Neurological: as above Psychiatric: No depression, insomnia, anxiety Endocrine: No palpitations, fatigue, diaphoresis, mood swings, change in appetite, change in weight, increased thirst Hematologic/Lymphatic:  No anemia, purpura, petechiae. Allergic/Immunologic: no itchy/runny eyes, nasal congestion, recent allergic reactions, rashes  PHYSICAL  EXAM: Vitals:   02/06/20 1529  BP: 115/75  Pulse: 78  Resp: 18  SpO2: 97%   General: No acute distress Head:  Normocephalic/atraumatic Skin/Extremities: No rash, no edema Neurological Exam: alert and oriented to person, place, and time. No aphasia or dysarthria. Fund of knowledge is appropriate.  Recent and remote memory are intact.  Attention and concentration are normal. Cranial nerves: Pupils equal, round, reactive to light.  Extraocular movements intact with no nystagmus. Visual fields full. No facial asymmetry.  Motor: Bulk and tone normal, muscle strength 5/5 throughout with no pronator drift. Finger to nose testing intact.  Gait narrow-based and steady, able to tandem walk adequately.  Romberg negative.   IMPRESSION: This is a very pleasant 54 yo RH with recurrent stereotyped episodes of deja vu with epigastric sensation since 2018, some of them waking her up from sleep. MRI brain unremarkable, her 48-hour EEG had shown rare left temporal slowing, no epileptiform discharges. There were 2 episodes of epigastric sensation with no EEG correlate. She had a good response to oxcarbazepine but developed hyponatremia, now on Oxtellar XR 600mg  daily with improvement in sodium levels, re-check Na today. We agreed to continue on current dose of Oxtellar, low threshold to increase if needed. She is aware of  [place] driving laws to stop driving if she starts having loss of awareness/consciousness. She will follow-up in 4 months and knows to call for any changes.   Thank you for allowing me to participate in her care.  Please do not hesitate to call for any questions or concerns.   Ellouise Newer, M.D.   CC: Dr. Jonni Sanger

## 2020-02-06 NOTE — Patient Instructions (Addendum)
Good to see you! 1. Continue Oxtellar XR 600mg  daily  2. Check sodium level today  3. Follow-up in 4 months, call for any changes  Seizure Precautions: 1. If medication has been prescribed for you to prevent seizures, take it exactly as directed.  Do not stop taking the medicine without talking to your doctor first, even if you have not had a seizure in a long time.   2. Avoid activities in which a seizure would cause danger to yourself or to others.  Don't operate dangerous machinery, swim alone, or climb in high or dangerous places, such as on ladders, roofs, or girders.  Do not drive unless your doctor says you may.  3. If you have any warning that you may have a seizure, lay down in a safe place where you can't hurt yourself.    4.  No driving for 6 months from last seizure, as per [facility].   Please refer to the following link on the [place] website for more information: http://[email or web address]/[email or web address]/drivingu.cfm   5.  Maintain good sleep hygiene. Avoid alcohol.  6.  Contact your doctor if you have any problems that may be related to the medicine you are taking.  7.  Call 911 and bring the patient back to the ED if:        A.  The seizure lasts longer than 5 minutes.       B.  The patient doesn't awaken shortly after the seizure  C.  The patient has new problems such as difficulty seeing, speaking or moving  D.  The patient was injured during the seizure  E.  The patient has a temperature over 102 F (39C)  F.  The patient vomited and now is having trouble breathing        Your provider has requested that you have labwork completed today. Please go to [facility] ([id number]) on the second floor of this building before leaving the office today. You do not need to check in. If you are not called within 15 minutes please check with the front desk.

## 2020-02-07 LAB — SODIUM: Sodium: 132 mEq/L — ABNORMAL LOW (ref 135–145)

## 2020-03-03 MED ORDER — LORAZEPAM 0.5 MG PO TABS: ORAL_TABLET | ORAL | 0 refills | Status: DC

## 2020-05-11 ENCOUNTER — Other Ambulatory Visit: Payer: Self-pay | Admitting: Family Medicine

## 2020-06-09 ENCOUNTER — Ambulatory Visit: Payer: 59 | Admitting: Neurology

## 2020-06-09 ENCOUNTER — Encounter: Payer: Self-pay | Admitting: Neurology

## 2020-06-09 ENCOUNTER — Other Ambulatory Visit: Payer: Self-pay

## 2020-06-09 VITALS — BP 129/82 | HR 71 | Ht 67.0 in | Wt 147.8 lb

## 2020-06-09 DIAGNOSIS — G40009 Localization-related (focal) (partial) idiopathic epilepsy and epileptic syndromes with seizures of localized onset, not intractable, without status epilepticus: Secondary | ICD-10-CM | POA: Diagnosis not present

## 2020-06-09 DIAGNOSIS — R202 Paresthesia of skin: Secondary | ICD-10-CM | POA: Diagnosis not present

## 2020-06-09 DIAGNOSIS — R253 Fasciculation: Secondary | ICD-10-CM | POA: Diagnosis not present

## 2020-06-09 MED ORDER — OXTELLAR XR 300 MG PO TB24: 300.0000 mg | ORAL_TABLET | Freq: Every day | ORAL | 0 refills | Status: DC

## 2020-06-09 NOTE — Progress Notes (Signed)
New symptoms to talk to you about

## 2020-06-09 NOTE — Patient Instructions (Signed)
1. Bloodwork for CMP, ionized calcium, magnesium, phosphorus, CK, aldolase, B12, TSH  2. Schedule EMG/NCV of right UE and LE  3. We will increase the Oxtellar XR to 900mg  daily for 2 weeks, then call our office for an update and we will plan to increase to 1200mg  daily if no issues  4. Continue Lexapro, we may consider increasing dose if no change in mood with increase in Oxtellar  5. Follow-up in 3-4 months, call for any changes   Seizure Precautions: 1. If medication has been prescribed for you to prevent seizures, take it exactly as directed.  Do not stop taking the medicine without talking to your doctor first, even if you have not had a seizure in a long time.   2. Avoid activities in which a seizure would cause danger to yourself or to others.  Don't operate dangerous machinery, swim alone, or climb in high or dangerous places, such as on ladders, roofs, or girders.  Do not drive unless your doctor says you may.  3. If you have any warning that you may have a seizure, lay down in a safe place where you can't hurt yourself.    4.  No driving for 6 months from last seizure, as per [facility].   Please refer to the following link on the [place] website for more information: http://[email or web address]/[email or web address]/driving/drivingu.cfm   5.  Maintain good sleep hygiene. Avoid alcohol.  6.  Contact your doctor if you have any problems that may be related to the medicine you are taking.  7.  Call 911 and bring the patient back to the ED if:        A.  The seizure lasts longer than 5 minutes.       B.  The patient doesn't awaken shortly after the seizure  C.  The patient has new problems such as difficulty seeing, speaking or moving  D.  The patient was injured during the seizure  E.  The patient has a temperature over 102 F (39C)  F.  The patient vomited and now is having trouble breathing

## 2020-06-09 NOTE — Progress Notes (Signed)
NEUROLOGY FOLLOW UP OFFICE NOTE  Krystal Rose [id number] 16-Jan-1966  HISTORY OF PRESENT ILLNESS: I had the pleasure of seeing Krystal Rose in follow-up in the neurology clinic on 06/09/2020.  The patient was last seen 4 months ago for seizures. She is alone for today's visit. She is not feeling well. Since her last visit, she continues to have clusters every 8-9 weeks. She contacted our office in June while visiting her daughter and having 4 seizures that morning. She was prescribed prn Ativan. She contacted our office on 8/19 about continued clusters, as well as overall feeling more anxious, more sensitive to wide open places and random pictures, lighting, environments. After the clusters, she has a hangover feeling that in the past would suddenly stop like an "off" switch, but recently it has been more prolonged, she feels down in the afternoon. She does not want to do things anymore, afraid to do them. She retired June 30th partly due to her symptoms and stress. She is currently on Oxtellar XR 600mg  daily without side effects. She has also been having several physical symptoms. Since the first week of August, she has had pain in the bottom/ball of her right foot, this then turned warm, then one night she had burning in the bottom of her foot like it was on fire, waking her up from sleep. Since then, she has had intermittent discomfort/bruised feeling. Hands and right foot are unaffected. She has had 2 areas of twitching, her right index finger would twitch for a couple of minutes, sometimes occurring a couple of times a day or several days apart. She does not feel it, but would look down and see her finger doing it. Her left quadriceps muscle has also been flexing up at night, she states it is not like a cramp/painful, but takes Ibuprofen. She had hyponatremia on oxcarbazepine (124), improved with switch to Oxtellar (last Na 132 in 01/2020). She had been craving salt recently, but has been  traveling and ate a lot of salt, so this feels better. She denies any neck/back pain.   History on Initial Assessment 03/27/2018: This is a very pleasant 54 year old right-handed woman with no significant past medical history, in her usual state of health until around October 2018 when she started having recurrent episodes of deja vu with associated epigastric sensation. 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 This was followed by a terrible sinking in her stomach like a drop on a roller coaster, then a wave of deep despair. Symptoms lasted 30 seconds or so, but recalls the sensation as feeling "horrible and painful." She had 2-3 of these over the next month or so, all occurring upon awakening. For a month she was symptom-free, then symptoms recurred occurring every 1-2 weeks, sometimes in clusters of 3-4 in 2 weeks. She described some of them, in February she had 4 in one night. In March, she had 3 episodes. With one of them, she had 5 in one day, one with associated lightheadedness like she would faint, feeling tingly in her face, then another where she felt tingling all over. The next day, she had a milder episode while in bed, again with some kind of memory like a classroom/library. In May she had 2 episodes in one day upon waking, she felt cool/tingly around the lips. She had 4 episodes in June, with one of them she  had a familiar memory of a person who she was not sure if she knew, then her stomach felt upset with nausea and lip tingling. The next day she had 3 milder episodes early morning, waking up up from sleep. She had 2 more that day lasting 1-2 minutes. The last episode occurred on 03/24/18, it was different because she was looking at a specific accent table picture online and felt the same sensation, she tried to scroll through other tables, then came back to the same table and again had the  sensation. There is no associated olfactory or gustatory hallucination except for one time when she had several in one night and smelled lemons in their bedroom. No associated focal weakness or post-event fatigue. No staring/unresponsive episodes, gaps in time, or myoclonic jerks. Sometimes she feels a sensation that it is going to happen, like if she looks to the right side it may occur. No clear triggers, maybe sleep. She gets an average of 6-7 hours of sleep. She has had headaches recently, but unrelated to these. She has headaches around once a week, either on the right hemisphere or the top of her head, no associated nausea/vomiting/phono/photophobia, with good response to Excedrin migraine. She feels the vision on her right eye varies, she had an eye exam with no significant abnormality reported. She recalls one episode 2 weeks ago while drinking 1 glass of martini when she felt she was slurring her speech for an hour, no other associated symptoms. She had pain in her shoulder blades 11 months ago and could not move for 3 days, this occurred at the peak of her stress when her daughter went to start [employer]. She reports that all of the above symptoms started around the time of stress with her daughter.   Epilepsy Risk Factors:  She had a normal birth and early development.  There is no history of febrile convulsions, CNS infections such as meningitis/encephalitis, significant traumatic brain injury, neurosurgical procedures, or family history of seizures.  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  She had a routine EEG which was normal, her 48-hour EEG in 03/2018 showed rare left temporal focal slowing, no epileptiform discharges. She had 2 episodes where she had the epigastric sensation with no EEG changes seen.  Prior AEDs:  oxcarbazepine (hyponatremia, Na 123)   PAST MEDICAL HISTORY: Past Medical History:  Diagnosis Date  . Allergy   . Basal cell carcinoma   . Benign colon polyp 01/06/2018   Colonoscopy 12/2017  . Focal seizures ([place]) 10/29/2019   Dr. Delice Lesch, neurology; MRI and EEG  . Frequent headaches   . History of whiplash injury to neck '04   recovered  . Menopausal state     MEDICATIONS: Current Outpatient Medications on File Prior to Visit  Medication Sig Dispense Refill  . escitalopram (LEXAPRO) 10 MG tablet TAKE 1 TABLET BY MOUTH EVERY DAY 90 tablet 3  . LORazepam (ATIVAN) 0.5 MG tablet Take 1 tablet as needed for seizure clusters. Do not take more than 3 in a week 10 tablet 0  . OXcarbazepine ER (OXTELLAR XR) 600 MG TB24 Take 1 tablet daily 90 tablet 3  . tretinoin (RETIN-A) 0.01 % gel Apply topically at bedtime.     No current facility-administered medications on file prior to visit.    ALLERGIES: Allergies  Allergen Reactions  .  Elastic Bandages & [Zinc]     Elastic in bathing suits and causes contact dermatitis  . Latex     Causes contact dermatitis    FAMILY HISTORY: Family History  Problem Relation Age of Onset  . Hypertension Mother   . Hyperlipidemia Mother   . Rheum arthritis Mother   . Arthritis Mother   . Hearing loss Mother   . Healthy Father   . Diabetes Paternal Grandfather   . COPD Paternal Grandfather   . Asthma Paternal Grandfather   . Cancer Paternal Grandfather   . Drug abuse Paternal Grandfather   . Healthy Brother   . Heart disease Maternal Uncle 36       died of MI  . Heart disease Maternal Grandmother   . Hearing loss Maternal Grandmother   . Heart attack Maternal Grandmother   . Stomach cancer Maternal Grandfather   . Healthy Daughter   . Healthy Daughter   . Colon polyps Neg Hx   . Colon cancer Neg Hx   . Esophageal cancer Neg Hx   . Rectal cancer Neg Hx     SOCIAL HISTORY: Social History   Socioeconomic History  . Marital status:  Married    Spouse name: Not on file  . Number of children: 2  . Years of education: 78  . Highest education level: Not on file  Occupational History  . Occupation: Nurse, children's, vestibular    Comment: works at [employer] 2 days per week  Tobacco Use  . Smoking status: Never Smoker  . Smokeless tobacco: Never Used  Vaping Use  . Vaping Use: Never used  Substance and Sexual Activity  . Alcohol use: Yes    Alcohol/week: 0.5 standard drinks    Types: 1 Standard drinks or equivalent per week    Comment: occasionally  . Drug use: No  . Sexual activity: Yes    Partners: Male  Other Topics Concern  . 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  Married '87. 2 dtrs - '96, '98. 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 Marriage in good health. No history of physical or sexual abuse.    Social Determinants of Health   Financial Resource Strain:   . Difficulty of Paying Living Expenses: Not on file  Food Insecurity:   . Worried About Charity fundraiser in the Last Year: Not on file  . Ran Out of Food in the Last Year: Not on file  Transportation Needs:   . Lack of Transportation (Medical): Not on file  . Lack of Transportation (Non-Medical): Not on file  Physical Activity:   . Days of Exercise per Week: Not on file  . Minutes of Exercise per Session: Not on file  Stress:   . Feeling of Stress : Not on file  Social Connections:   . Frequency of Communication with Friends and Family: Not on file  . Frequency of Social Gatherings with Friends and Family: Not on file  . Attends Religious Services: Not on file  . Active Member of Clubs or Organizations: Not on file  . Attends Archivist Meetings: Not on file  . Marital Status: Not on file  Intimate Partner Violence:   . Fear of Current or Ex-Partner: Not on file  . Emotionally Abused: Not on file  . Physically Abused: Not on file  . Sexually Abused: Not on file     PHYSICAL EXAM: Vitals:   06/09/20 1421  BP: 129/82  Pulse: 71  SpO2: 97%   General: No acute distress Head:  Normocephalic/atraumatic Skin/Extremities: No rash, no edema Neurological Exam: alert and oriented to person, place, and time. No aphasia or dysarthria. Fund of knowledge is appropriate.  Recent and remote memory are intact.  Attention and concentration are normal.    Cranial nerves: Pupils equal, round, reactive to light.  Extraocular movements intact with no nystagmus. Visual fields full. Facial sensation intact. No facial asymmetry.  Motor: Bulk and tone normal, muscle strength 5/5 throughout with no pronator drift.  Sensation to light touch, temperature, pin. Decreased vibration sense to ankles bilaterally. Deep tendon reflexes brisk +2 throughout,no clonus, negative Hoffman sign. Finger to nose testing intact.  Gait narrow-based and steady, able to tandem walk adequately.  Romberg negative.   IMPRESSION: This is a very pleasant 54 yo RH with recurrent stereotyped episodes of deja vu with epigastric sensation since 2018, some of them waking her up from sleep. MRI brain unremarkable, her 48-hour EEG had shown rare left temporal slowing, no epileptiform discharges. There were 2 episodes of epigastric sensation with no EEG correlate. She had a good response to oxcarbazepine but developed hyponatremia, now on Oxtellar XR but with continued clusters every 8-9 weeks. We discussed increasing dose to 900mg  daily for 2 weeks, she will contact our office for an update, if no side effects, plan to increase to 1200mg  daily. She is also reporting muscle twitching/burning pain. Exam shows mild length-dependent neuropathy, she will be scheduled for an EMG/NCV of both LE. Bloodwork for CMP, ionized calcium, magnesium, phosphorus, CK, aldolase, B12, TSH will be ordered. Depending on results, we may consider imaging of the cervical spine. She is also reporting more anxiety, if no improvement with higher  dose of Oxtellar, we can try increasing Lexapro dose. She is aware of [place] driving laws to stop driving after an episode of loss of awareness/consciousness, until 6 months seizure-free. Follow-up in 3-4 months, she knows to call for any changes.   Thank you for allowing me to participate in her care.  Please do not hesitate to call for any questions or concerns.   Ellouise Newer, M.D.   CC: Dr. Jonni Sanger

## 2020-06-11 ENCOUNTER — Other Ambulatory Visit: Payer: 59

## 2020-06-11 ENCOUNTER — Other Ambulatory Visit: Payer: Self-pay

## 2020-06-11 ENCOUNTER — Ambulatory Visit (INDEPENDENT_AMBULATORY_CARE_PROVIDER_SITE_OTHER): Payer: 59 | Admitting: Neurology

## 2020-06-11 DIAGNOSIS — G40009 Localization-related (focal) (partial) idiopathic epilepsy and epileptic syndromes with seizures of localized onset, not intractable, without status epilepticus: Secondary | ICD-10-CM

## 2020-06-11 DIAGNOSIS — R202 Paresthesia of skin: Secondary | ICD-10-CM

## 2020-06-11 DIAGNOSIS — R253 Fasciculation: Secondary | ICD-10-CM

## 2020-06-11 LAB — COMPREHENSIVE METABOLIC PANEL
ALT: 17 U/L (ref 0–35)
AST: 24 U/L (ref 0–37)
Albumin: 4.2 g/dL (ref 3.5–5.2)
Alkaline Phosphatase: 57 U/L (ref 39–117)
BUN: 15 mg/dL (ref 6–23)
CO2: 27 mEq/L (ref 19–32)
Calcium: 9 mg/dL (ref 8.4–10.5)
Chloride: 95 mEq/L — ABNORMAL LOW (ref 96–112)
Creatinine, Ser: 0.61 mg/dL (ref 0.40–1.20)
GFR: 101.38 mL/min (ref >60.00)
Glucose, Bld: 96 mg/dL (ref 70–99)
Potassium: 3.9 mEq/L (ref 3.5–5.1)
Sodium: 129 mEq/L — ABNORMAL LOW (ref 135–145)
Total Bilirubin: 0.4 mg/dL (ref 0.2–1.2)
Total Protein: 6.8 g/dL (ref 6.0–8.3)

## 2020-06-11 LAB — VITAMIN B12: Vitamin B-12: 318 pg/mL (ref 211–911)

## 2020-06-11 LAB — TSH: TSH: 1.16 u[IU]/mL (ref 0.35–4.50)

## 2020-06-11 LAB — CK: Total CK: 234 U/L — ABNORMAL HIGH (ref 7–177)

## 2020-06-11 LAB — PHOSPHORUS: Phosphorus: 3.8 mg/dL (ref 2.3–4.6)

## 2020-06-11 LAB — MAGNESIUM: Magnesium: 2 mg/dL (ref 1.5–2.5)

## 2020-06-11 NOTE — Procedures (Signed)
[facility] Neurology  [address]  [place], [place] [address] Tel: [phone] Fax:  [phone] Test Date:  06/11/2020  Patient: Krystal Rose DOB: January 25, 1966 Physician: Narda Amber, DO  Sex: Female Height: 5\' 7"  Ref Phys: Ellouise Newer, M.D.  ID#: [id number] Temp: 32.0C Technician:    Patient Complaints: This is a 54 year old female referred for evaluation this is a 54 year old female referred for evaluation of bilateral feet pain and paresthesias.  NCV & EMG Findings: Electrodiagnostic testing of the right lower extremity and additional studies of the left shows: 1. Bilateral sural and superficial peroneal sensory responses are within normal limits. 2. Bilateral peroneal and tibial motor responses are within normal limits. 3. Bilateral tibial H reflex studies are within normal limits. 4. There is no evidence of active or chronic motor axonal changes affecting any of the tested muscles.  Motor unit configuration and recruitment pattern is within normal limits.   Impression: This is a normal study of the lower extremities.  In particular, there is no evidence of a sensorimotor polyneuropathy or lumbosacral radiculopathy.   ___________________________ Narda Amber, DO    Nerve Conduction Studies Anti Sensory Summary Table   Stim Site NR Peak (ms) Norm Peak (ms) P-T Amp (V) Norm P-T Amp  Left Sup Peroneal Anti Sensory (Ant Lat Mall)  32C  12 cm    3.5 <4.6 6.0 >4  Right Sup Peroneal Anti Sensory (Ant Lat Mall)  32C  12 cm    2.3 <4.6 5.7 >4  Left Sural Anti Sensory (Lat Mall)  32C  Calf    3.4 <4.6 11.8 >4  Right Sural Anti Sensory (Lat Mall)  32C  Calf    3.5 <4.6 10.1 >4   Motor Summary Table   Stim Site NR Onset (ms) Norm Onset (ms) O-P Amp (mV) Norm O-P Amp Site1 Site2 Delta-0 (ms) Dist (cm) Vel (m/s) Norm Vel (m/s)  Left Peroneal Motor (Ext Dig Brev)  32C  Ankle    5.0 <6.0 3.2 >2.5 B Fib Ankle 7.0 35.0 50 >40  B Fib    12.0  3.2  Poplt B  Fib 1.8 8.0 44 >40  Poplt    13.8  3.0         Right Peroneal Motor (Ext Dig Brev)  32C  Ankle    3.7 <6.0 4.4 >2.5 B Fib Ankle 8.6 37.0 43 >40  B Fib    12.3  4.3  Poplt B Fib 1.5 8.0 53 >40  Poplt    13.8  4.2         Left Tibial Motor (Abd Hall Brev)  32C  Ankle    3.8 <6.0 9.6 >4 Knee Ankle 8.5 41.0 48 >40  Knee    12.3  8.0         Right Tibial Motor (Abd Hall Brev)  32C  Ankle    3.6 <6.0 9.0 >4 Knee Ankle 8.4 40.0 48 >40  Knee    12.0  7.7          H Reflex Studies   NR H-Lat (ms) Lat Norm (ms) L-R H-Lat (ms)  Left Tibial (Gastroc)  32C     34.83 <35 0.00  Right Tibial (Gastroc)  32C     34.83 <35 0.00   EMG   Side Muscle Ins Act Fibs Psw Fasc Number Recrt Dur Dur. Amp Amp. Poly Poly. Comment  Right GluteusMed Nml Nml Nml Nml Nml Nml Nml Nml Nml Nml Nml Nml N/A  Right AntTibialis Nml Nml Nml Nml Nml Nml Nml Nml Nml Nml Nml Nml N/A  Right Gastroc Nml Nml Nml Nml Nml Nml Nml Nml Nml Nml Nml Nml N/A  Right Flex Dig Long Nml Nml Nml Nml Nml Nml Nml Nml Nml Nml Nml Nml N/A  Right RectFemoris Nml Nml Nml Nml Nml Nml Nml Nml Nml Nml Nml Nml N/A  Left AntTibialis Nml Nml Nml Nml Nml Nml Nml Nml Nml Nml Nml Nml N/A  Left Gastroc Nml Nml Nml Nml Nml Nml Nml Nml Nml Nml Nml Nml N/A  Left Flex Dig Long Nml Nml Nml Nml Nml Nml Nml Nml Nml Nml Nml Nml N/A  Left RectFemoris Nml Nml Nml Nml Nml Nml Nml Nml Nml Nml Nml Nml N/A  Left GluteusMed Nml Nml Nml Nml Nml Nml Nml Nml Nml Nml Nml Nml N/A      Waveforms:

## 2020-06-12 LAB — CALCIUM, IONIZED: Calcium, Ion: 5 mg/dL (ref 4.8–5.6)

## 2020-06-12 LAB — ALDOLASE: Aldolase: 5.5 U/L (ref <=8.1)

## 2020-06-18 ENCOUNTER — Other Ambulatory Visit: Payer: Self-pay

## 2020-06-18 ENCOUNTER — Encounter: Payer: Self-pay | Admitting: Neurology

## 2020-06-18 ENCOUNTER — Ambulatory Visit: Payer: 59 | Admitting: Neurology

## 2020-06-18 VITALS — BP 119/64 | HR 68 | Resp 18 | Ht 68.0 in | Wt 151.0 lb

## 2020-06-18 DIAGNOSIS — G40009 Localization-related (focal) (partial) idiopathic epilepsy and epileptic syndromes with seizures of localized onset, not intractable, without status epilepticus: Secondary | ICD-10-CM

## 2020-06-18 MED ORDER — LACOSAMIDE 50 MG PO TABS: 50.0000 mg | ORAL_TABLET | Freq: Two times a day (BID) | ORAL | 0 refills | Status: DC

## 2020-06-18 MED ORDER — VIMPAT 100 MG PO TABS: 100.0000 mg | ORAL_TABLET | Freq: Two times a day (BID) | ORAL | 0 refills | Status: DC

## 2020-06-18 MED ORDER — OXTELLAR XR 300 MG PO TB24: 300.0000 mg | ORAL_TABLET | Freq: Every day | ORAL | 0 refills | Status: DC

## 2020-06-18 NOTE — Patient Instructions (Signed)
1. Start Vimpat 50mg : take 1/2 tablet twice a day for a week, then increase to 1 tablet twice a day for a week, then increase to Vimpat 100mg : Take 1 tablet twice a day and continue   2. Continue the Oxtellar 600mg  daily for 1 week, then reduce to 300mg  once a day for a week, then stop Oxtellar (you would be increasing to Vimpat 100mg  twice a day at that time)  3. Take as needed lorazepam for clusters  4. Follow-up in 3-4 months, call for any changes   Seizure Precautions: 1. If medication has been prescribed for you to prevent seizures, take it exactly as directed.  Do not stop taking the medicine without talking to your doctor first, even if you have not had a seizure in a long time.   2. Avoid activities in which a seizure would cause danger to yourself or to others.  Don't operate dangerous machinery, swim alone, or climb in high or dangerous places, such as on ladders, roofs, or girders.  Do not drive unless your doctor says you may.  3. If you have any warning that you may have a seizure, lay down in a safe place where you can't hurt yourself.    4.  No driving for 6 months from last seizure, as per [facility].   Please refer to the following link on the [place] website for more information: http://[email or web address]/[email or web address]/driving/drivingu.cfm   5.  Maintain good sleep hygiene. Avoid alcohol.  6.  Contact your doctor if you have any problems that may be related to the medicine you are taking.  7.  Call 911 and bring the patient back to the ED if:        A.  The seizure lasts longer than 5 minutes.       B.  The patient doesn't awaken shortly after the seizure  C.  The patient has new problems such as difficulty seeing, speaking or moving  D.  The patient was injured during the seizure  E.  The patient has a temperature over 102 F (39C)  F.  The patient vomited and now is having trouble breathing
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
## 2020-06-20 ENCOUNTER — Encounter: Payer: Self-pay | Admitting: Neurology

## 2020-07-21 ENCOUNTER — Other Ambulatory Visit: Payer: Self-pay | Admitting: Neurology

## 2020-07-21 MED ORDER — VIMPAT 100 MG PO TABS
100.0000 mg | ORAL_TABLET | Freq: Two times a day (BID) | ORAL | 5 refills | Status: DC
Start: 2020-07-21 — End: 2020-10-31

## 2020-09-18 ENCOUNTER — Ambulatory Visit: Payer: 59 | Admitting: Neurology

## 2020-10-02 ENCOUNTER — Other Ambulatory Visit: Payer: Self-pay

## 2020-10-02 ENCOUNTER — Other Ambulatory Visit (INDEPENDENT_AMBULATORY_CARE_PROVIDER_SITE_OTHER): Payer: No Typology Code available for payment source

## 2020-10-02 ENCOUNTER — Ambulatory Visit: Payer: No Typology Code available for payment source | Admitting: Neurology

## 2020-10-02 ENCOUNTER — Encounter: Payer: Self-pay | Admitting: Neurology

## 2020-10-02 VITALS — BP 118/80 | HR 97 | Resp 18 | Ht 68.0 in | Wt 150.0 lb

## 2020-10-02 DIAGNOSIS — E871 Hypo-osmolality and hyponatremia: Secondary | ICD-10-CM

## 2020-10-02 DIAGNOSIS — G40009 Localization-related (focal) (partial) idiopathic epilepsy and epileptic syndromes with seizures of localized onset, not intractable, without status epilepticus: Secondary | ICD-10-CM

## 2020-10-02 LAB — CK: Total CK: 90 U/L (ref 7–177)

## 2020-10-02 LAB — SODIUM: Sodium: 137 mEq/L (ref 135–145)
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
## 2020-10-27 ENCOUNTER — Telehealth: Payer: Self-pay | Admitting: Neurology

## 2020-10-27 NOTE — Telephone Encounter (Signed)
Pls see if Krystal Rose can help with her Vimpat, thanks

## 2020-10-28 NOTE — Telephone Encounter (Signed)
Pt is coming to the office to fill out form for [employer] assistance

## 2020-10-31 ENCOUNTER — Other Ambulatory Visit: Payer: Self-pay

## 2020-10-31 MED ORDER — VIMPAT 100 MG PO TABS
100.0000 mg | ORAL_TABLET | Freq: Two times a day (BID) | ORAL | 5 refills | Status: DC
Start: 2020-10-31 — End: 2020-12-25

## 2020-11-03 ENCOUNTER — Ambulatory Visit (INDEPENDENT_AMBULATORY_CARE_PROVIDER_SITE_OTHER): Payer: No Typology Code available for payment source | Admitting: Family Medicine

## 2020-11-03 ENCOUNTER — Other Ambulatory Visit: Payer: Self-pay

## 2020-11-03 VITALS — BP 116/84 | HR 71 | Temp 97.5°F | Ht 68.0 in | Wt 151.6 lb

## 2020-11-03 DIAGNOSIS — Z23 Encounter for immunization: Secondary | ICD-10-CM

## 2020-11-03 DIAGNOSIS — R569 Unspecified convulsions: Secondary | ICD-10-CM

## 2020-11-03 DIAGNOSIS — Z0000 Encounter for general adult medical examination without abnormal findings: Secondary | ICD-10-CM | POA: Diagnosis not present

## 2020-11-03 DIAGNOSIS — F4322 Adjustment disorder with anxiety: Secondary | ICD-10-CM | POA: Diagnosis not present

## 2020-11-03 DIAGNOSIS — K635 Polyp of colon: Secondary | ICD-10-CM | POA: Diagnosis not present

## 2020-11-03 LAB — LIPID PANEL
Cholesterol: 181 mg/dL (ref 0–200)
HDL: 66.7 mg/dL (ref >39.00)
LDL Cholesterol: 103 mg/dL — ABNORMAL HIGH (ref 0–99)
NonHDL: 114.44
Total CHOL/HDL Ratio: 3
Triglycerides: 59 mg/dL (ref 0.0–149.0)
VLDL: 11.8 mg/dL (ref 0.0–40.0)

## 2020-11-03 LAB — COMPREHENSIVE METABOLIC PANEL
ALT: 13 U/L (ref 0–35)
AST: 20 U/L (ref 0–37)
Albumin: 4.1 g/dL (ref 3.5–5.2)
Alkaline Phosphatase: 47 U/L (ref 39–117)
BUN: 19 mg/dL (ref 6–23)
CO2: 31 mEq/L (ref 19–32)
Calcium: 9.7 mg/dL (ref 8.4–10.5)
Chloride: 101 mEq/L (ref 96–112)
Creatinine, Ser: 0.68 mg/dL (ref 0.40–1.20)
GFR: 97.22 mL/min (ref >60.00)
Glucose, Bld: 85 mg/dL (ref 70–99)
Potassium: 4.1 mEq/L (ref 3.5–5.1)
Sodium: 138 mEq/L (ref 135–145)
Total Bilirubin: 0.4 mg/dL (ref 0.2–1.2)
Total Protein: 6.9 g/dL (ref 6.0–8.3)

## 2020-11-03 LAB — CBC WITH DIFFERENTIAL/PLATELET
Basophils Absolute: 0 10*3/uL (ref 0.0–0.1)
Basophils Relative: 0.7 % (ref 0.0–3.0)
Eosinophils Absolute: 0.2 10*3/uL (ref 0.0–0.7)
Eosinophils Relative: 3.3 % (ref 0.0–5.0)
HCT: 36.9 % (ref 36.0–46.0)
Hemoglobin: 12.6 g/dL (ref 12.0–15.0)
Lymphocytes Relative: 20.9 % (ref 12.0–46.0)
Lymphs Abs: 1.4 10*3/uL (ref 0.7–4.0)
MCHC: 34.3 g/dL (ref 30.0–36.0)
MCV: 94.4 fl (ref 78.0–100.0)
Monocytes Absolute: 0.6 10*3/uL (ref 0.1–1.0)
Monocytes Relative: 8.4 % (ref 3.0–12.0)
Neutro Abs: 4.5 10*3/uL (ref 1.4–7.7)
Neutrophils Relative %: 66.7 % (ref 43.0–77.0)
Platelets: 239 10*3/uL (ref 150.0–400.0)
RBC: 3.91 Mil/uL (ref 3.87–5.11)
RDW: 12.7 % (ref 11.5–15.5)
WBC: 6.8 10*3/uL (ref 4.0–10.5)

## 2020-11-03 LAB — TSH: TSH: 1.96 u[IU]/mL (ref 0.35–4.50)

## 2020-11-03 NOTE — Progress Notes (Signed)
Subjective  Chief Complaint  Patient presents with  . Annual Exam    Fasting   . Ear Pain    She's been having pain in her right ear. She states that she isn't having any pain right now but she would still like you to look at it.   . Neuro Notes    She just wants to discuss her neurology notes and make sure everything is up to date.   . Immunizations    Shingrix today ?     HPI: Krystal Rose is a 55 y.o. female who presents to [facility] today for a Female Wellness Visit. She also has the concerns and/or needs as listed above in the chief complaint. These will be addressed in addition to the Health Maintenance Visit.   Wellness Visit: annual visit with health maintenance review and exam without Pap   HM: due for female wellness with GYN and mammo. patient to schedule.  She is due for her colonoscopy.  Eligible for Shingrix.  She declines a flu shot at this time.  Typically will get done in the fall.  Continues healthy lifestyle. Chronic disease f/u and/or acute problem visit: (deemed necessary to be done in addition to the wellness visit):  Seizures versus pseudoseizures: Continues to work with neurology.  I reviewed all recent notes.  Medications have been adjusted and she is feels they are stable.  She tends to have seizure clusters about every 2 months.  Also seeing a therapist: Helping her with anxiety and history of past trauma.  Adjustment anxiety with panic: Continues to feel Lexapro is very helpful to control her symptoms.  Working with therapist to sort through triggers.  No adverse effects.  Serrated colon polyp: Due surveillance colonoscopy.  Patient to schedule.  Assessment  1. Annual physical exam   2. Focal seizures ([place])   3. Serrated polyp of colon   4. Adjustment disorder with anxiety      Plan  Female Wellness Visit:  Age appropriate Health Maintenance and Prevention measures were discussed with patient. Included topics are  cancer screening recommendations, ways to keep healthy (see AVS) including dietary and exercise recommendations, regular eye and dental care, use of seat belts, and avoidance of moderate alcohol use and tobacco use.  Patient to schedule with GYN for mammogram and wellness female visit  BMI: discussed patient's BMI and encouraged positive lifestyle modifications to help get to or maintain a target BMI.  HM needs and immunizations were addressed and ordered. See below for orders. See HM and immunization section for updates.  Shingrix first dose given today.  Routine labs and screening tests ordered including cmp, cbc and lipids where appropriate.  Discussed recommendations regarding Vit D and calcium supplementation (see AVS)  Chronic disease management visit and/or acute problem visit:  Seizures: Continue per neurology.  Anxiety: Continue Lexapro, refilled 10 mg daily.  Continue with psychotherapy.  Counseling given.  Precancerous polyps: Due for colonoscopy in April.  Patient to schedule.  Follow up: 12 months for complete physical Orders Placed This Encounter  Procedures  . CBC with Differential/Platelet  . Comprehensive metabolic panel  . Lipid panel  . TSH   No orders of the defined types were placed in this encounter.     There is no height or weight on file to calculate BMI. Wt Readings from Last 3 Encounters:  10/02/20 150 lb (68 kg)  06/18/20 151 lb (68.5 kg)  06/09/20 147 lb 12.8 oz (  67 kg)     Patient Active Problem List   Diagnosis Date Noted  . Focal seizures ([place]) 10/29/2019    Dr. Delice Lesch, neurology; MRI and EEG   . Spell of altered consciousness 09/22/2018  . Panic attack 09/22/2018  . Serrated polyp of colon 01/06/2018    Colonoscopy 12/2017, precancerous, q 3 year recall, Dr Silverio Decamp   . Headache disorder 12/23/2017    Dehudration; treated with excedrin migrain   . Premature menopause 12/23/2017  . Dyspareunia in female 12/23/2017    Started  estradiol march 2019; menopausal    Health Maintenance  Topic Date Due  . COVID-19 Vaccine (3 - Booster for Pfizer series) 04/24/2020  . INFLUENZA VACCINE  04/27/2020  . MAMMOGRAM  07/09/2020  . COLONOSCOPY (Pts 45-63yrs Insurance coverage will need to be confirmed)  01/02/2021  . PAP SMEAR-Modifier  12/01/2022  . TETANUS/TDAP  12/24/2027  . Hepatitis C Screening  Completed  . HIV Screening  Completed   Immunization History  Administered Date(s) Administered  . Influenza,inj,Quad PF,6+ Mos 07/27/2017, 07/18/2018, 06/28/2019  . Influenza-Unspecified 07/10/2013, 07/10/2014, 07/23/2015  . PFIZER(Purple Top)SARS-COV-2 Vaccination 10/09/2019, 10/26/2019  . Tdap 12/23/2017   We updated and reviewed the patient's past history in detail and it is documented below. Allergies: Patient is allergic to elastic bandages & [zinc] and latex. Past Medical History Patient  has a past medical history of Allergy, Basal cell carcinoma, Benign colon polyp (01/06/2018), Focal seizures ([place]) (10/29/2019), Frequent headaches, History of whiplash injury to neck ('04), and Menopausal state. Past Surgical History Patient  has a past surgical history that includes Shoulder arthroscopy w/ rotator cuff repair (2009); Dental surgery; and basal cell carcinoma removed. Family History: Patient family history includes Arthritis in her mother; Asthma in her paternal grandfather; COPD in her paternal grandfather; Cancer in her paternal grandfather; Diabetes in her paternal grandfather; Drug abuse in her paternal grandfather; Healthy in her brother, daughter, daughter, and father; Hearing loss in her maternal grandmother and mother; Heart attack in her maternal grandmother; Heart disease in her maternal grandmother; Heart disease (age of onset: 43) in her maternal uncle; Hyperlipidemia in her mother; Hypertension in her mother; Rheum arthritis in her mother; Stomach cancer in her maternal grandfather. Social History:  Patient   reports that she has never smoked. She has never used smokeless tobacco. She reports current alcohol use of about 0.5 standard drinks of alcohol per week. She reports that she does not use drugs.  Review of Systems: Constitutional: negative for fever or malaise Ophthalmic: negative for photophobia, double vision or loss of vision Cardiovascular: negative for chest pain, dyspnea on exertion, or new LE swelling Respiratory: negative for SOB or persistent cough Gastrointestinal: negative for abdominal pain, change in bowel habits or melena Genitourinary: negative for dysuria or gross hematuria, no abnormal uterine bleeding or disharge Musculoskeletal: negative for new gait disturbance or muscular weakness Integumentary: negative for new or persistent rashes, no breast lumps Neurological: negative for TIA or stroke symptoms Psychiatric: negative for SI or delusions Allergic/Immunologic: negative for hives  Patient Care Team    Relationship Specialty Notifications Start End  Leamon Arnt, MD PCP - General Family Medicine  12/21/17   Justice Britain, MD  Orthopedic Surgery  01/15/11   Marylynn Pearson, MD Consulting Physician Obstetrics and Gynecology  12/23/17   Jarome Matin, MD Consulting Physician Dermatology  12/23/17   Nat Christen, MD Attending Physician Optometry  12/23/17    Comment: I think they are in [place] now, [facility]  Dr. Merrilee Jansky    12/23/17    Comment: [facility]  Cameron Sprang, MD Consulting Physician Neurology  03/26/19   Mauri Pole, MD Consulting Physician Gastroenterology  11/03/20     Objective  Vitals: There were no vitals taken for this visit. General:  Well developed, well nourished, no acute distress  Psych:  Alert and orientedx3,normal mood and affect HEENT:  Normocephalic, atraumatic, non-icteric sclera,  supple neck without adenopathy, mass or thyromegaly Cardiovascular:  Normal S1, S2, RRR without gallop, rub or  murmur Respiratory:  Good breath sounds bilaterally, CTAB with normal respiratory effort Gastrointestinal: normal bowel sounds, soft, non-tender, no noted masses. No HSM MSK: no deformities, contusions. Joints are without erythema or swelling.  Skin:  Warm, no rashes or suspicious lesions noted Neurologic:    Mental status is normal. CN 2-11 are normal. Gross motor and sensory exams are normal. Normal gait. No tremor   Commons side effects, risks, benefits, and alternatives for medications and treatment plan prescribed today were discussed, and the patient expressed understanding of the given instructions. Patient is instructed to call or message via MyChart if he/she has any questions or concerns regarding our treatment plan. No barriers to understanding were identified. We discussed Red Flag symptoms and signs in detail. Patient expressed understanding regarding what to do in case of urgent or emergency type symptoms.   Medication list was reconciled, printed and provided to the patient in AVS. Patient instructions and summary information was reviewed with the patient as documented in the AVS. This note was prepared with assistance of Dragon voice recognition software. Occasional wrong-word or sound-a-like substitutions may have occurred due to the inherent limitations of voice recognition software  This visit occurred during the SARS-CoV-2 public health emergency.  Safety protocols were in place, including screening questions prior to the visit, additional usage of staff PPE, and extensive cleaning of exam room while observing appropriate contact time as indicated for disinfecting solutions.

## 2020-11-03 NOTE — Patient Instructions (Addendum)
Please return in 12 months for your annual complete physical; please come fasting.  I will release your lab results to you on your MyChart account with further instructions. Please reply with any questions.   Please contact your GI and GYN offices to schedule your GYN physical with mammogram and get your colonoscopy scheduled which is due in April.   If you have any questions or concerns, please don't hesitate to send me a message via MyChart or call the office at [phone]. Thank you for visiting with [place] today! It's our pleasure caring for you.

## 2020-11-12 ENCOUNTER — Other Ambulatory Visit: Payer: Self-pay

## 2020-11-12 MED ORDER — LORAZEPAM 0.5 MG PO TABS
ORAL_TABLET | ORAL | 5 refills | Status: DC
Start: 2020-11-12 — End: 2021-08-17

## 2020-12-25 ENCOUNTER — Telehealth: Payer: Self-pay

## 2020-12-25 ENCOUNTER — Other Ambulatory Visit: Payer: Self-pay | Admitting: Neurology

## 2020-12-25 MED ORDER — LACOSAMIDE 200 MG PO TABS: 200.0000 mg | ORAL_TABLET | Freq: Two times a day (BID) | ORAL | 5 refills | Status: DC

## 2020-12-25 NOTE — Telephone Encounter (Signed)
Samples of vimpat 150#15 given per Dr.Aquino.noted in book

## 2020-12-29 ENCOUNTER — Telehealth: Payer: Self-pay

## 2020-12-29 NOTE — Telephone Encounter (Signed)
Medication Samples have been provided to the patient.  Drug name:Vimpat      Strength: 150 mg       Qty: 3 box  LOT: [id number]     Exp.Date: 05/2021  Dosing instructions: take one tab BID   Jake Seats  11:36 AM 12/29/2020

## 2021-01-06 ENCOUNTER — Other Ambulatory Visit: Payer: Self-pay

## 2021-01-06 ENCOUNTER — Ambulatory Visit (INDEPENDENT_AMBULATORY_CARE_PROVIDER_SITE_OTHER): Payer: No Typology Code available for payment source

## 2021-01-06 DIAGNOSIS — Z23 Encounter for immunization: Secondary | ICD-10-CM

## 2021-01-06 NOTE — Progress Notes (Signed)
Krystal Rose 55 year old female presents in office today for Shingrix vaccine per Billey Chang, MD. This is patients second vaccine. Administered SHINGRIX 0.5 ml IM right deltoid. Patient tolerated injection well. Will update [place] with any symptoms if they arise.

## 2021-01-26 ENCOUNTER — Encounter: Payer: Self-pay | Admitting: Gastroenterology

## 2021-03-25 ENCOUNTER — Other Ambulatory Visit: Payer: Self-pay

## 2021-03-25 ENCOUNTER — Encounter: Payer: Self-pay | Admitting: Neurology

## 2021-03-25 ENCOUNTER — Ambulatory Visit: Payer: No Typology Code available for payment source | Admitting: Neurology

## 2021-03-25 VITALS — BP 130/73 | HR 66 | Ht 68.0 in | Wt 152.8 lb

## 2021-03-25 DIAGNOSIS — G40009 Localization-related (focal) (partial) idiopathic epilepsy and epileptic syndromes with seizures of localized onset, not intractable, without status epilepticus: Secondary | ICD-10-CM

## 2021-03-25 NOTE — Patient Instructions (Signed)
Good to see you! Continue Vimpat 200mg  twice a day. Continue seizure calendar. Follow-up in 5-6 months, call for any changes.   Seizure Precautions: 1. If medication has been prescribed for you to prevent seizures, take it exactly as directed.  Do not stop taking the medicine without talking to your doctor first, even if you have not had a seizure in a long time.   2. Avoid activities in which a seizure would cause danger to yourself or to others.  Don't operate dangerous machinery, swim alone, or climb in high or dangerous places, such as on ladders, roofs, or girders.  Do not drive unless your doctor says you may.  3. If you have any warning that you may have a seizure, lay down in a safe place where you can't hurt yourself.    4.  No driving for 6 months from last seizure, as per [facility].   Please refer to the following link on the [place] website for more information: http://www.epilepsyfoundation.org/answerplace/Social/driving/drivingu.cfm   5.  Maintain good sleep hygiene. Avoid alcohol.  6.  Contact your doctor if you have any problems that may be related to the medicine you are taking.  7.  Call 911 and bring the patient back to the ED if:        A.  The seizure lasts longer than 5 minutes.       B.  The patient doesn't awaken shortly after the seizure  C.  The patient has new problems such as difficulty seeing, speaking or moving  D.  The patient was injured during the seizure  E.  The patient has a temperature over 102 F (39C)  F.  The patient vomited and now is having trouble breathing

## 2021-03-25 NOTE — Progress Notes (Signed)
NEUROLOGY FOLLOW UP OFFICE NOTE  Krystal Rose [id number] Feb 17, 1966  HISTORY OF PRESENT ILLNESS: I had the pleasure of seeing Krystal Rose in follow-up in the neurology clinic on 03/25/2021.  The patient was last seen 5 months ago for seizures. She is alone in the office today. Since her last visit, Lacosamide has been increased to 200mg  BID. On her last communication last month via MyChart, she reported an increase in oral numbness, body/scalp itching, dizziness/balance checks, and sleep disturbances when she initially increased dose. This has improved, she still gets the numbness around her mouth/tongue soon after taking medication or after drinking diet coke but it is better. She still has a little bit of itching, no rash. She has noticed a little shakiness lasting a couple of minutes, and wonders if this is caffeine related. She brings her seizure calendar. She had a seizure-free period of over a month, from 3/28 to 5/18. On 5/18, while attending her daughter's graduation in [place], she felt that being in a room with navy blue color would provoke a seizure. She switched to a different room and woke up, telling her husband she would have seizures that day. It is hard to describe what she feels, she went into the navy blue room and it triggered her, she had a full and strong sensation in her head. She did not get the hot tingling in her hand, she was able to talk and said she had to get out of the room. An hour later she had one, then another. They were milder with no nausea/upset stomach or sensory symptoms. She felt good after with no hangover feeling. She then had another episode on 6/18, waking up to a mild seizure at 7:45am. She felt "dark in my head," with a bad feeling of depression, sick feeling/heaviness in her stomach that was brief. She woke up feeling better. She states she feels the unwellness more strongly but they do not last as long, the sense of dread is also shorter or absent. She has  noticed a prodrome where she feels very sleepy for 3 days prior to a seizure. She had an isolated mild one on 6/20, then most recently 6/28 she was on the computer when she had the pronounced wave of doom/dread, almost frightening. She tried to distract herself then felt fine within 10-15 seconds.   Hyponatremia resolved with discontinuation of Oxtellar/oxcarbazepine, 137 in January 2022. Her prior CK was elevated at 234, repeat level was 90 in January 2022.    History on Initial Assessment 03/27/2018: This is a very pleasant 55 year old right-handed woman with no significant past medical history, in her usual state of health until around October 2018 when she started having recurrent episodes of deja vu with associated epigastric sensation. 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 This was followed by a terrible sinking in her stomach like a drop on a roller coaster, then a wave of deep despair. Symptoms lasted 30 seconds or so, but recalls the sensation as feeling "horrible and painful." She had 2-3 of these over the next month or so, all occurring upon awakening. For a month she was symptom-free, then symptoms recurred occurring every 1-2 weeks, sometimes in clusters of 3-4 in 2 weeks. She described some of them, in February she had 4 in one night. In March, she had 3 episodes. With one of them, she had  5 in one day, one with associated lightheadedness like she would faint, feeling tingly in her face, then another where she felt tingling all over. The next day, she had a milder episode while in bed, again with some kind of memory like a classroom/library. In May she had 2 episodes in one day upon waking, she felt cool/tingly around the lips. She had 4 episodes in June, with one of them she had a familiar memory of a person who she was not sure if she knew, then her stomach felt upset with nausea and lip  tingling. The next day she had 3 milder episodes early morning, waking up up from sleep. She had 2 more that day lasting 1-2 minutes. The last episode occurred on 03/24/18, it was different because she was looking at a specific accent table picture online and felt the same sensation, she tried to scroll through other tables, then came back to the same table and again had the sensation. There is no associated olfactory or gustatory hallucination except for one time when she had several in one night and smelled lemons in their bedroom. No associated focal weakness or post-event fatigue. No staring/unresponsive episodes, gaps in time, or myoclonic jerks. Sometimes she feels a sensation that it is going to happen, like if she looks to the right side it may occur. No clear triggers, maybe sleep. She gets an average of 6-7 hours of sleep. She has had headaches recently, but unrelated to these. She has headaches around once a week, either on the right hemisphere or the top of her head, no associated nausea/vomiting/phono/photophobia, with good response to Excedrin migraine. She feels the vision on her right eye varies, she had an eye exam with no significant abnormality reported. She recalls one episode 2 weeks ago while drinking 1 glass of martini when she felt she was slurring her speech for an hour, no other associated symptoms. She had pain in her shoulder blades 11 months ago and could not move for 3 days, this occurred at the peak of her stress when her daughter went to start [employer]. She reports that all of the above symptoms started around the time of stress with her daughter.   Epilepsy Risk Factors:  She had a normal birth and early development.  There is no history of febrile convulsions, CNS infections such as meningitis/encephalitis, significant traumatic brain injury, neurosurgical procedures, or family history of seizures.  Diagnostic Data:  MRI brain with and without contrast done 03/2018  did not show any acute changes, hippocampi symmetric with no abnormal signal or enhancement, there was note of partially empty sella.  She had a routine EEG which was normal, her 48-hour EEG in 03/2018 showed rare left temporal focal slowing, no epileptiform discharges. She had 2 episodes where she had the epigastric sensation with no EEG changes seen.  Prior AEDs: oxcarbazepine (hyponatremia, Na 123), Oxtellar XR  PAST MEDICAL HISTORY: Past Medical History:  Diagnosis Date   Allergy    Basal cell carcinoma    Benign colon polyp 01/06/2018   Colonoscopy 12/2017   Focal seizures ([place]) 10/29/2019   Dr. Delice Lesch, neurology; MRI and EEG   Frequent headaches    History of whiplash injury to neck '04   recovered   Menopausal state     MEDICATIONS: Current Outpatient Medications on File Prior to Visit  Medication Sig Dispense Refill   escitalopram (LEXAPRO) 10 MG tablet TAKE 1 TABLET BY MOUTH EVERY DAY 90 tablet 3   lacosamide (  VIMPAT) 200 MG TABS tablet Take 1 tablet (200 mg total) by mouth 2 (two) times daily. 60 tablet 5   LORazepam (ATIVAN) 0.5 MG tablet Take 1 tablet as needed for seizure clusters. Do not take more than 3 in a week 10 tablet 5   tretinoin (RETIN-A) 0.01 % gel Apply topically at bedtime.     No current facility-administered medications on file prior to visit.    ALLERGIES: Allergies  Allergen Reactions   Elastic Bandages & [Zinc]     Elastic in bathing suits and causes contact dermatitis   Latex     Causes contact dermatitis    FAMILY HISTORY: Family History  Problem Relation Age of Onset   Hypertension Mother    Hyperlipidemia Mother    Rheum arthritis Mother    Arthritis Mother    Hearing loss Mother    Healthy Father    Diabetes Paternal Grandfather    COPD Paternal Grandfather    Asthma Paternal Grandfather    Cancer Paternal Grandfather    Drug abuse Paternal Grandfather    Healthy Brother    Heart disease Maternal Uncle 41       died of MI   Heart  disease Maternal Grandmother    Hearing loss Maternal Grandmother    Heart attack Maternal Grandmother    Stomach cancer Maternal Grandfather    Healthy Daughter    Healthy Daughter    Colon polyps Neg Hx    Colon cancer Neg Hx    Esophageal cancer Neg Hx    Rectal cancer Neg Hx     SOCIAL HISTORY: Social History   Socioeconomic History   Marital status: Married    Spouse name: Not on file   Number of children: 2   Years of education: 18   Highest education level: Not on file  Occupational History   Occupation: Nurse, children's, vestibular    Comment: works at [employer] 2 days per week  Tobacco Use   Smoking status: Never   Smokeless tobacco: Never  Vaping Use   Vaping Use: Never used  Substance and Sexual Activity   Alcohol use: Yes    Alcohol/week: 0.5 standard drinks    Types: 1 Standard drinks or equivalent per week    Comment: occasionally   Drug use: No   Sexual activity: Yes    Partners: Male  Other Topics Concern   Not on file  Social History Narrative   [employer], 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  Married '87. 2 dtrs - '96, '98. 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 Marriage in good health. No history of physical or sexual abuse.    Left handed   Social Determinants of Health   Financial Resource Strain: Not on file  Food Insecurity: Not on file  Transportation Needs: Not on file  Physical Activity: Not on file  Stress: Not on file  Social Connections: Not on file  Intimate Partner Violence: Not on file     PHYSICAL EXAM: Vitals:   03/25/21 1357  BP: 130/73  Pulse: 66  SpO2: 99%   General: No acute distress Head:  Normocephalic/atraumatic Skin/Extremities: No rash, no edema Neurological Exam: alert and awake. No aphasia or dysarthria. Fund of knowledge is appropriate.  Recent and remote memory are intact.  Attention and concentration are normal.   Cranial nerves: Pupils equal, round. Extraocular movements intact with no  nystagmus. Visual fields full.  No facial asymmetry.  Motor: Bulk and tone normal, muscle strength 5/5 throughout with no  pronator drift.   Finger to nose testing intact.  Gait narrow-based and steady, able to tandem walk adequately.   IMPRESSION: This is a very pleasant 55 yo RH with recurrent stereotyped episodes of deja vu with epigastric sensation since 2018, some of them waking her up from sleep. MRI brain unremarkable, her 48-hour EEG had shown rare left temporal slowing, no epileptiform discharges. There were 2 episodes of epigastric sensation with no EEG correlate. 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 She has noticed an improvement in symptoms on Vimpat 200mg  BID with tolerable side effects. Sodium levels are normal off oxcarbazepine/Oxtellar. Continue seizure calendar. She knows to stop driving after an episode of loss of awareness until 6 months seizure-free. Follow-up in 5-6 months, call for any changes.   Thank you for allowing me to participate in her care.  Please do not hesitate to call for any questions or concerns.   Ellouise Newer, M.D.   CC: Dr. Jonni Sanger

## 2021-04-04 ENCOUNTER — Other Ambulatory Visit: Payer: Self-pay | Admitting: Family Medicine

## 2021-04-22 MED ORDER — LACOSAMIDE 200 MG PO TABS: 200.0000 mg | ORAL_TABLET | Freq: Two times a day (BID) | ORAL | 0 refills | Status: DC

## 2021-05-19 ENCOUNTER — Other Ambulatory Visit: Payer: Self-pay

## 2021-05-19 MED ORDER — LACOSAMIDE 200 MG PO TABS: 200.0000 mg | ORAL_TABLET | Freq: Two times a day (BID) | ORAL | 3 refills | Status: DC

## 2021-07-24 ENCOUNTER — Telehealth: Payer: Self-pay

## 2021-07-24 MED ORDER — ESCITALOPRAM OXALATE 10 MG PO TABS: 10.0000 mg | ORAL_TABLET | Freq: Every day | ORAL | 1 refills | Status: DC

## 2021-07-24 NOTE — Telephone Encounter (Signed)
LAST APPOINTMENT DATE: 11/03/20  NEXT APPOINTMENT DATE: 11/04/21  MEDICATION:escitalopram (LEXAPRO) 10 MG tablet  PHARMACY: [id number] - [place], [place] - [address] AT [address]

## 2021-07-24 NOTE — Telephone Encounter (Signed)
Has been filled.

## 2021-08-17 ENCOUNTER — Encounter: Payer: Self-pay | Admitting: Neurology

## 2021-08-17 ENCOUNTER — Ambulatory Visit: Payer: No Typology Code available for payment source | Admitting: Neurology

## 2021-08-17 ENCOUNTER — Other Ambulatory Visit: Payer: Self-pay | Admitting: Neurology

## 2021-08-17 ENCOUNTER — Other Ambulatory Visit: Payer: Self-pay

## 2021-08-17 VITALS — BP 129/85 | HR 64 | Ht 68.0 in | Wt 150.8 lb

## 2021-08-17 DIAGNOSIS — G40009 Localization-related (focal) (partial) idiopathic epilepsy and epileptic syndromes with seizures of localized onset, not intractable, without status epilepticus: Secondary | ICD-10-CM | POA: Diagnosis not present

## 2021-08-17 MED ORDER — LORAZEPAM 0.5 MG PO TABS: ORAL_TABLET | ORAL | 5 refills | Status: DC

## 2021-08-17 MED ORDER — CLOBAZAM 10 MG PO TABS: ORAL_TABLET | ORAL | 5 refills | Status: DC

## 2021-08-17 NOTE — Patient Instructions (Signed)
Good to see you!  Start taking Clobazam 10mg  every night for 7 days around the time of clusters  2. Continue Vimpat 200mg  twice a day  3. Refills for as needed lorazepam were sent to your pharmacy  4. Follow-up in 4-5 months, call for any changes   Seizure Precautions: 1. If medication has been prescribed for you to prevent seizures, take it exactly as directed.  Do not stop taking the medicine without talking to your doctor first, even if you have not had a seizure in a long time.   2. Avoid activities in which a seizure would cause danger to yourself or to others.  Don't operate dangerous machinery, swim alone, or climb in high or dangerous places, such as on ladders, roofs, or girders.  Do not drive unless your doctor says you may.  3. If you have any warning that you may have a seizure, lay down in a safe place where you can't hurt yourself.    4.  No driving for 6 months from last seizure, as per [facility].   Please refer to the following link on the [place] website for more information: http://www.epilepsyfoundation.org/answerplace/Social/driving/drivingu.cfm   5.  Maintain good sleep hygiene. Avoid alcohol.  6.  Contact your doctor if you have any problems that may be related to the medicine you are taking.  7.  Call 911 and bring the patient back to the ED if:        A.  The seizure lasts longer than 5 minutes.       B.  The patient doesn't awaken shortly after the seizure  C.  The patient has new problems such as difficulty seeing, speaking or moving  D.  The patient was injured during the seizure  E.  The patient has a temperature over 102 F (39C)  F.  The patient vomited and now is having trouble breathing
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
## 2021-08-19 ENCOUNTER — Telehealth: Payer: Self-pay

## 2021-08-19 NOTE — Telephone Encounter (Signed)
New message - website CoverMyMeds  This request has received an approval. View the bottom of the request for an electronic copy of the approval letter.   Chalonda Kooi KeyLeonette Most - PA Case ID: [id number] - Rx #: [id number] Need help? Call us at [phone] Outcome Approvedtoday Your PA request has been approved. Additional information will be provided in the approval communication. (Message 1145) Drug cloBAZam 10MG  tablets Form [employer] Electronic PA Form [phone] NCPDP) Original Claim Info [place] REQ-MD CALL [phone].DRUG REQUIRES PRIOR AUTHORIZATION(PHARMACY HELP DESK [phone])

## 2021-11-04 ENCOUNTER — Encounter: Payer: Self-pay | Admitting: Family Medicine

## 2021-11-04 ENCOUNTER — Other Ambulatory Visit: Payer: Self-pay

## 2021-11-04 ENCOUNTER — Ambulatory Visit (INDEPENDENT_AMBULATORY_CARE_PROVIDER_SITE_OTHER): Payer: No Typology Code available for payment source | Admitting: Family Medicine

## 2021-11-04 VITALS — BP 128/82 | HR 65 | Temp 98.0°F | Ht 68.0 in | Wt 145.8 lb

## 2021-11-04 DIAGNOSIS — R519 Headache, unspecified: Secondary | ICD-10-CM | POA: Diagnosis not present

## 2021-11-04 DIAGNOSIS — K635 Polyp of colon: Secondary | ICD-10-CM

## 2021-11-04 DIAGNOSIS — R569 Unspecified convulsions: Secondary | ICD-10-CM

## 2021-11-04 DIAGNOSIS — Z0000 Encounter for general adult medical examination without abnormal findings: Secondary | ICD-10-CM

## 2021-11-04 DIAGNOSIS — Z0001 Encounter for general adult medical examination with abnormal findings: Secondary | ICD-10-CM

## 2021-11-04 DIAGNOSIS — F4322 Adjustment disorder with anxiety: Secondary | ICD-10-CM | POA: Diagnosis not present

## 2021-11-04 LAB — CBC WITH DIFFERENTIAL/PLATELET
Basophils Absolute: 0 10*3/uL (ref 0.0–0.1)
Basophils Relative: 1 % (ref 0.0–3.0)
Eosinophils Absolute: 0.3 10*3/uL (ref 0.0–0.7)
Eosinophils Relative: 7.8 % — ABNORMAL HIGH (ref 0.0–5.0)
HCT: 38.4 % (ref 36.0–46.0)
Hemoglobin: 12.8 g/dL (ref 12.0–15.0)
Lymphocytes Relative: 34.1 % (ref 12.0–46.0)
Lymphs Abs: 1.2 10*3/uL (ref 0.7–4.0)
MCHC: 33.4 g/dL (ref 30.0–36.0)
MCV: 93 fl (ref 78.0–100.0)
Monocytes Absolute: 0.4 10*3/uL (ref 0.1–1.0)
Monocytes Relative: 11 % (ref 3.0–12.0)
Neutro Abs: 1.6 10*3/uL (ref 1.4–7.7)
Neutrophils Relative %: 46.1 % (ref 43.0–77.0)
Platelets: 229 10*3/uL (ref 150.0–400.0)
RBC: 4.13 Mil/uL (ref 3.87–5.11)
RDW: 13.3 % (ref 11.5–15.5)
WBC: 3.6 10*3/uL — ABNORMAL LOW (ref 4.0–10.5)

## 2021-11-04 LAB — TSH: TSH: 1.64 u[IU]/mL (ref 0.35–5.50)

## 2021-11-04 LAB — COMPREHENSIVE METABOLIC PANEL
ALT: 10 U/L (ref 0–35)
AST: 18 U/L (ref 0–37)
Albumin: 4.3 g/dL (ref 3.5–5.2)
Alkaline Phosphatase: 45 U/L (ref 39–117)
BUN: 15 mg/dL (ref 6–23)
CO2: 28 mEq/L (ref 19–32)
Calcium: 9.7 mg/dL (ref 8.4–10.5)
Chloride: 102 mEq/L (ref 96–112)
Creatinine, Ser: 0.81 mg/dL (ref 0.40–1.20)
GFR: 80.46 mL/min (ref >60.00)
Glucose, Bld: 87 mg/dL (ref 70–99)
Potassium: 4.1 mEq/L (ref 3.5–5.1)
Sodium: 141 mEq/L (ref 135–145)
Total Bilirubin: 0.6 mg/dL (ref 0.2–1.2)
Total Protein: 7 g/dL (ref 6.0–8.3)

## 2021-11-04 LAB — LIPID PANEL
Cholesterol: 165 mg/dL (ref 0–200)
HDL: 67 mg/dL (ref >39.00)
LDL Cholesterol: 89 mg/dL (ref 0–99)
NonHDL: 97.58
Total CHOL/HDL Ratio: 2
Triglycerides: 44 mg/dL (ref 0.0–149.0)
VLDL: 8.8 mg/dL (ref 0.0–40.0)
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
## 2021-11-04 NOTE — Patient Instructions (Signed)
Please return in 12 months for your annual complete physical; please come fasting.   I will release your lab results to you on your MyChart account with further instructions. Please reply with any questions.    Please schedule your colonoscopy and GYN appointments.   If you have any questions or concerns, please don't hesitate to send me a message via MyChart or call the office at [phone]. Thank you for visiting with [place] today! It's our pleasure caring for you.   Please do these things to maintain good health!  Exercise at least 30-45 minutes a day,  4-5 days a week.  Eat a low-fat diet with lots of fruits and vegetables, up to 7-9 servings per day. Drink plenty of water daily. Try to drink 8 8oz glasses per day. Seatbelts can save your life. Always wear your seatbelt. Place Smoke Detectors on every level of your home and check batteries every year. Schedule an appointment with an eye doctor for an eye exam every 1-2 years Safe sex - use condoms to protect yourself from STDs if you could be exposed to these types of infections. Use birth control if you do not want to become pregnant and are sexually active. Avoid heavy alcohol use. If you drink, keep it to less than 2 drinks/day and not every day. [place].  Choose someone you trust that could speak for you if you became unable to speak for yourself. Depression is common in our stressful world.If you're feeling down or losing interest in things you normally enjoy, please come in for a visit. If anyone is threatening or hurting you, please get help. Physical or Emotional Violence is never OK.

## 2021-12-16 ENCOUNTER — Encounter: Payer: Self-pay | Admitting: Neurology

## 2021-12-16 ENCOUNTER — Ambulatory Visit (INDEPENDENT_AMBULATORY_CARE_PROVIDER_SITE_OTHER): Payer: No Typology Code available for payment source | Admitting: Neurology

## 2021-12-16 ENCOUNTER — Other Ambulatory Visit: Payer: Self-pay

## 2021-12-16 VITALS — BP 129/73 | HR 67 | Ht 68.0 in | Wt 147.5 lb

## 2021-12-16 DIAGNOSIS — G40009 Localization-related (focal) (partial) idiopathic epilepsy and epileptic syndromes with seizures of localized onset, not intractable, without status epilepticus: Secondary | ICD-10-CM | POA: Diagnosis not present

## 2021-12-16 MED ORDER — LACOSAMIDE 200 MG PO TABS: 200.0000 mg | ORAL_TABLET | Freq: Two times a day (BID) | ORAL | 3 refills | Status: DC

## 2021-12-16 MED ORDER — LORAZEPAM 0.5 MG PO TABS
ORAL_TABLET | ORAL | 5 refills | Status: DC
Start: 2021-12-16 — End: 2022-08-16

## 2021-12-16 MED ORDER — CLOBAZAM 10 MG PO TABS: ORAL_TABLET | ORAL | 5 refills | Status: DC

## 2021-12-16 NOTE — Patient Instructions (Signed)
Good to see you. ? ?Start taking the clobazam '10mg'$  every night ? ?2. Continue Vimpat (Lacosamide) '200mg'$  twice a day ? ?3. Refills sent for lorazepam as needed ? ?4. Continue seizure calendar ? ?5. Follow-up in 4 months, call for any changes ? ? ?Seizure Precautions: ?1. If medication has been prescribed for you to prevent seizures, take it exactly as directed.  Do not stop taking the medicine without talking to your doctor first, even if you have not had a seizure in a long time.  ? ?2. Avoid activities in which a seizure would cause danger to yourself or to others.  Don't operate dangerous machinery, swim alone, or climb in high or dangerous places, such as on ladders, roofs, or girders.  Do not drive unless your doctor says you may. ? ?3. If you have any warning that you may have a seizure, lay down in a safe place where you can't hurt yourself.   ? ?4.  No driving for 6 months from last seizure, as per [facility].   Please refer to the following link on the [place] website for more information: http://[email or web address]/[email or web address]/driving/drivingu.cfm  ? ?5.  Maintain good sleep hygiene. Avoid alcohol. ? ?6.  Contact your doctor if you have any problems that may be related to the medicine you are taking. ? ?7.  Call 911 and bring the patient back to the ED if: ?      ? A.  The seizure lasts longer than 5 minutes.      ? B.  The patient doesn't awaken shortly after the seizure ? C.  The patient has new problems such as difficulty seeing, speaking or moving ? D.  The patient was injured during the seizure ? E.  The patient has a temperature over 102 F (39C) ? F.  The patient vomited and now is having trouble breathing ?      ? ?
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
## 2022-01-17 ENCOUNTER — Other Ambulatory Visit: Payer: Self-pay | Admitting: Family Medicine

## 2022-02-08 ENCOUNTER — Encounter: Payer: Self-pay | Admitting: Neurology

## 2022-03-25 ENCOUNTER — Encounter: Payer: Self-pay | Admitting: Neurology

## 2022-04-15 ENCOUNTER — Encounter: Payer: Self-pay | Admitting: Neurology

## 2022-04-15 ENCOUNTER — Ambulatory Visit (INDEPENDENT_AMBULATORY_CARE_PROVIDER_SITE_OTHER): Payer: No Typology Code available for payment source | Admitting: Neurology

## 2022-04-15 VITALS — BP 120/77 | HR 71 | Ht 68.0 in | Wt 147.0 lb

## 2022-04-15 DIAGNOSIS — G40009 Localization-related (focal) (partial) idiopathic epilepsy and epileptic syndromes with seizures of localized onset, not intractable, without status epilepticus: Secondary | ICD-10-CM | POA: Diagnosis not present

## 2022-04-15 MED ORDER — LACOSAMIDE 200 MG PO TABS
200.0000 mg | ORAL_TABLET | Freq: Two times a day (BID) | ORAL | 3 refills | Status: DC
Start: 2022-04-15 — End: 2022-11-16

## 2022-04-15 MED ORDER — CLOBAZAM 10 MG PO TABS: ORAL_TABLET | ORAL | 3 refills | Status: DC
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
## 2022-04-15 NOTE — Patient Instructions (Signed)
Always good to see you. Continue all your medications, let me know when you need refills for the lorazepam. Have good rest of the summer, see you in 4 months!   Seizure Precautions: 1. If medication has been prescribed for you to prevent seizures, take it exactly as directed.  Do not stop taking the medicine without talking to your doctor first, even if you have not had a seizure in a long time.   2. Avoid activities in which a seizure would cause danger to yourself or to others.  Don't operate dangerous machinery, swim alone, or climb in high or dangerous places, such as on ladders, roofs, or girders.  Do not drive unless your doctor says you may.  3. If you have any warning that you may have a seizure, lay down in a safe place where you can't hurt yourself.    4.  No driving for 6 months from last seizure, as per [facility].   Please refer to the following link on the [place] website for more information: [email or web address]/[email or web address]   5.  Maintain good sleep hygiene. Avoid alcohol.  6.  Contact your doctor if you have any problems that may be related to the medicine you are taking.  7.  Call 911 and bring the patient back to the ED if:        A.  The seizure lasts longer than 5 minutes.       B.  The patient doesn't awaken shortly after the seizure  C.  The patient has new problems such as difficulty seeing, speaking or moving  D.  The patient was injured during the seizure  E.  The patient has a temperature over 102 F (39C)  F.  The patient vomited and now is having trouble breathing

## 2022-05-18 ENCOUNTER — Other Ambulatory Visit: Payer: Self-pay | Admitting: Family Medicine

## 2022-06-21 ENCOUNTER — Encounter: Payer: Self-pay | Admitting: *Deleted

## 2022-08-16 ENCOUNTER — Ambulatory Visit (INDEPENDENT_AMBULATORY_CARE_PROVIDER_SITE_OTHER): Payer: No Typology Code available for payment source | Admitting: Neurology

## 2022-08-16 ENCOUNTER — Encounter: Payer: Self-pay | Admitting: Neurology

## 2022-08-16 VITALS — BP 126/76 | HR 67 | Resp 20 | Ht 68.0 in | Wt 150.0 lb

## 2022-08-16 DIAGNOSIS — G40009 Localization-related (focal) (partial) idiopathic epilepsy and epileptic syndromes with seizures of localized onset, not intractable, without status epilepticus: Secondary | ICD-10-CM

## 2022-08-16 MED ORDER — LORAZEPAM 0.5 MG PO TABS: ORAL_TABLET | ORAL | 5 refills | Status: DC

## 2022-08-16 MED ORDER — CLOBAZAM 10 MG PO TABS: ORAL_TABLET | ORAL | 3 refills | Status: DC

## 2022-08-16 NOTE — Patient Instructions (Signed)
Always good to see you.  Increase Clobazam '10mg'$ : take 1 and 1/2 tablets every night  2. Continue Vimpat '200mg'$  twice a day  3. Refills sent for as needed lorazepam  4. Continue seizure calendar  5. Follow-up in 4 months, call for any changes   Seizure Precautions: 1. If medication has been prescribed for you to prevent seizures, take it exactly as directed.  Do not stop taking the medicine without talking to your doctor first, even if you have not had a seizure in a long time.   2. Avoid activities in which a seizure would cause danger to yourself or to others.  Don't operate dangerous machinery, swim alone, or climb in high or dangerous places, such as on ladders, roofs, or girders.  Do not drive unless your doctor says you may.  3. If you have any warning that you may have a seizure, lay down in a safe place where you can't hurt yourself.    4.  No driving for 6 months from last seizure, as per [facility].   Please refer to the following link on the [place] website for more information: [email or web address]/[email or web address]   5.  Maintain good sleep hygiene. Avoid alcohol.  6.  Contact your doctor if you have any problems that may be related to the medicine you are taking.  7.  Call 911 and bring the patient back to the ED if:        A.  The seizure lasts longer than 5 minutes.       B.  The patient doesn't awaken shortly after the seizure  C.  The patient has new problems such as difficulty seeing, speaking or moving  D.  The patient was injured during the seizure  E.  The patient has a temperature over 102 F (39C)  F.  The patient vomited and now is having trouble breathing

## 2022-08-16 NOTE — Progress Notes (Signed)
NEUROLOGY FOLLOW UP OFFICE NOTE  Krystal Rose [id number] [birth year]  HISTORY OF PRESENT ILLNESS: I had the pleasure of seeing Krystal Rose in follow-up in the neurology clinic on 08/16/2022.  The patient was last seen 4 months ago for seizures. She is alone in the office today. Since her last visit, she reports that she is disappointed. She had a couple of flashes at the end of July, it was a stressful day, she had to get her husband to drive but symptoms did not progress. She had one on 9/30. Then, 27 days later, she had 6 in 3 days. She had 1 margarita the night prior to the cluster and forgot her evening medications, waking up on 10/26 feeling strange. She had a mild episode that night then waves of dread, doom, and anxiety. She took a dose of lorazepam. On 10/27, she was woken up by an episode and felt depressed for a little while. She had another at 4pm and took lorazepam, then another episode at 5pm. ON 10/28, she was again woken up by another fairly mild episode but until noon she felt waves of sinking and slipping. If she would rest and lay down, she would go into another one. She felt anxious, panicky, and weird. No loss of awareness with any of them. She was hesitant to take another lorazepam dose in 3 days. She denies any sleep deprivation. She continues on Lacosamide '200mg'$  BID and Clobazam '10mg'$  qhs. She does not that her brain feels much better having less seizures, and she has not had any deja vu episodes as well, however there are still remnants even if she is not having seizures but to a much less degree. For a time she felt empty, "there's a hole in my soul," where she did not care that she did not care. She is on Lexapro '10mg'$  daily.    History on Initial Assessment 03/27/2018: This is a very pleasant 56 year old right-handed woman with no significant past medical history, in her usual state of health until around October 2018 when she started having recurrent episodes of deja vu  with associated epigastric sensation. 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 This was followed by a terrible sinking in her stomach like a drop on a roller coaster, then a wave of deep despair. Symptoms lasted 30 seconds or so, but recalls the sensation as feeling "horrible and painful." She had 2-3 of these over the next month or so, all occurring upon awakening. For a month she was symptom-free, then symptoms recurred occurring every 1-2 weeks, sometimes in clusters of 3-4 in 2 weeks. She described some of them, in February she had 4 in one night. In March, she had 3 episodes. With one of them, she had 5 in one day, one with associated lightheadedness like she would faint, feeling tingly in her face, then another where she felt tingling all over. The next day, she had a milder episode while in bed, again with some kind of memory like a classroom/library. In May she had 2 episodes in one day upon waking, she felt cool/tingly around the lips. She had 4 episodes in June, with one of them she had a familiar memory of a person who she was not sure if she knew, then her stomach felt upset with nausea and lip tingling. The next day she had 3 milder episodes early  morning, waking up up from sleep. She had 2 more that day lasting 1-2 minutes. The last episode occurred on 03/24/18, it was different because she was looking at a specific accent table picture online and felt the same sensation, she tried to scroll through other tables, then came back to the same table and again had the sensation. There is no associated olfactory or gustatory hallucination except for one time when she had several in one night and smelled lemons in their bedroom. No associated focal weakness or post-event fatigue. No staring/unresponsive episodes, gaps in time, or myoclonic jerks. Sometimes she feels a sensation that it is going to happen,  like if she looks to the right side it may occur. No clear triggers, maybe sleep. She gets an average of 6-7 hours of sleep. She has had headaches recently, but unrelated to these. She has headaches around once a week, either on the right hemisphere or the top of her head, no associated nausea/vomiting/phono/photophobia, with good response to Excedrin migraine. She feels the vision on her right eye varies, she had an eye exam with no significant abnormality reported. She recalls one episode 2 weeks ago while drinking 1 glass of martini when she felt she was slurring her speech for an hour, no other associated symptoms. She had pain in her shoulder blades 11 months ago and could not move for 3 days, this occurred at the peak of her stress when her daughter went to start [employer]. She reports that all of the above symptoms started around the time of stress with her daughter.   Epilepsy Risk Factors:  She had a normal birth and early development.  There is no history of febrile convulsions, CNS infections such as meningitis/encephalitis, significant traumatic brain injury, neurosurgical procedures, or family history of seizures.  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  She had a routine EEG which was normal, her 48-hour EEG in 03/2018 showed rare left temporal focal slowing, no epileptiform discharges. She had 2 episodes where she had the epigastric sensation with no EEG changes seen.  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 Delice Lesch, neurology; MRI and EEG   Frequent headaches    History of whiplash injury to neck '04    recovered   Menopausal state     MEDICATIONS: Current Outpatient Medications on File Prior to Visit  Medication Sig Dispense Refill   cloBAZam (ONFI) 10 MG tablet Take 1 tablet every night 90 tablet 3   escitalopram (LEXAPRO) 10 MG tablet TAKE 1 TABLET BY MOUTH EVERY DAY 90 tablet 1   lacosamide (VIMPAT) 200 MG TABS tablet Take 1 tablet (200 mg total) by mouth 2 (two) times daily. 180 tablet 3   LORazepam (ATIVAN) 0.5 MG tablet Take 1 tablet as needed for seizure clusters. Do not take more than 3 in a week 10 tablet 5   No current facility-administered medications on file prior to visit.    ALLERGIES: Allergies  Allergen Reactions   Elastic Bandages & [Zinc]     Elastic in bathing suits and causes contact dermatitis   Latex  Causes contact dermatitis    FAMILY HISTORY: Family History  Problem Relation Age of Onset   Hypertension Mother    Hyperlipidemia Mother    Rheum arthritis Mother    Arthritis Mother    Hearing loss Mother    Healthy Father    Diabetes Paternal Grandfather    COPD Paternal Grandfather    Asthma Paternal Grandfather    Cancer Paternal Grandfather    Drug abuse Paternal Grandfather    Healthy Brother    Heart disease Maternal Uncle 46       died of MI   Heart disease Maternal Grandmother    Hearing loss Maternal Grandmother    Heart attack Maternal Grandmother    Stomach cancer Maternal Grandfather    Healthy Daughter    Healthy Daughter    Colon polyps Neg Hx    Colon cancer Neg Hx    Esophageal cancer Neg Hx    Rectal cancer Neg Hx     SOCIAL HISTORY: Social History   Socioeconomic History   Marital status: Married    Spouse name: Not on file   Number of children: 2   Years of education: 18   Highest education level: Not on file  Occupational History   Occupation: Nurse, children's, vestibular    Comment: works at [employer] 2 days per week  Tobacco Use   Smoking status: Never   Smokeless tobacco: Never  Vaping Use   Vaping  Use: Never used  Substance and Sexual Activity   Alcohol use: Yes    Alcohol/week: 0.5 standard drinks of alcohol    Types: 1 Standard drinks or equivalent per week    Comment: occasionally   Drug use: No   Sexual activity: Yes    Partners: Male  Other Topics Concern   Not on file  Social History Narrative   [employer], 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  Married '87. 2 dtrs - '96, '98. 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 Marriage in good health. No history of physical or sexual abuse.    Left handed   Caffeine 2 cups daily   Social Determinants of Health   Financial Resource Strain: Not on file  Food Insecurity: Not on file  Transportation Needs: Not on file  Physical Activity: Not on file  Stress: Not on file  Social Connections: Not on file  Intimate Partner Violence: Not on file     PHYSICAL EXAM: Vitals:   08/16/22 1559  BP: 126/76  Pulse: 67  Resp: 20  SpO2: 97%   General: No acute distress Head:  Normocephalic/atraumatic Skin/Extremities: No rash, no edema Neurological Exam: alert and awake. No aphasia or dysarthria. Fund of knowledge is appropriate.  Attention and concentration are normal.   Cranial nerves: Pupils equal, round. Extraocular movements intact with no nystagmus. Visual fields full.  No facial asymmetry.  Motor: Bulk and tone normal, muscle strength 5/5 throughout with no pronator drift.   Finger to nose testing intact.  Gait narrow-based and steady, able to tandem walk adequately.  Romberg negative.   IMPRESSION: This is a very pleasant 56 yo RH with recurrent stereotyped episodes of deja vu with epigastric sensation since 2018, some of them waking her up from sleep. MRI brain unremarkable, her 48-hour EEG had shown rare left temporal slowing, no epileptiform discharges. There were 2 episodes of epigastric sensation with no EEG correlate. 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 She  has had significant improvement in symptoms with  addition of clobazam but continues to have seizure clusters with prolonged post-ictal mood changes. We discussed increasing clobazam to '15mg'$  qhs, continue Lacosamide '200mg'$  BID. She has prn lorazapem for clusters. May consider increasing Lexapro dose with PCP. Continue seizure calendar. She is aware of [place] driving laws to stop driving after an episode of loss of awareness until 6 months seizure-free. Follow-up in 4 months, call for any changes.     Thank you for allowing me to participate in her care.  Please do not hesitate to call for any questions or concerns.   Ellouise Newer, M.D.   CC: Dr. Jonni Sanger

## 2022-09-09 ENCOUNTER — Encounter: Payer: Self-pay | Admitting: *Deleted

## 2022-11-05 ENCOUNTER — Encounter: Payer: No Typology Code available for payment source | Admitting: Family Medicine

## 2022-11-16 ENCOUNTER — Other Ambulatory Visit: Payer: Self-pay | Admitting: Family Medicine

## 2022-11-16 ENCOUNTER — Other Ambulatory Visit: Payer: Self-pay | Admitting: Neurology

## 2022-11-29 ENCOUNTER — Encounter: Payer: Self-pay | Admitting: Family Medicine

## 2022-11-29 ENCOUNTER — Ambulatory Visit: Payer: No Typology Code available for payment source | Admitting: Family Medicine

## 2022-11-29 ENCOUNTER — Encounter: Payer: Self-pay | Admitting: Gastroenterology

## 2022-11-29 VITALS — BP 100/70 | HR 76 | Temp 98.0°F | Ht 68.0 in | Wt 145.4 lb

## 2022-11-29 DIAGNOSIS — F4322 Adjustment disorder with anxiety: Secondary | ICD-10-CM

## 2022-11-29 DIAGNOSIS — R519 Headache, unspecified: Secondary | ICD-10-CM

## 2022-11-29 DIAGNOSIS — Z0000 Encounter for general adult medical examination without abnormal findings: Secondary | ICD-10-CM

## 2022-11-29 DIAGNOSIS — R569 Unspecified convulsions: Secondary | ICD-10-CM | POA: Diagnosis not present

## 2022-11-29 DIAGNOSIS — K635 Polyp of colon: Secondary | ICD-10-CM | POA: Diagnosis not present

## 2022-11-29 LAB — COMPREHENSIVE METABOLIC PANEL
ALT: 11 U/L (ref 0–35)
AST: 20 U/L (ref 0–37)
Albumin: 4.1 g/dL (ref 3.5–5.2)
Alkaline Phosphatase: 52 U/L (ref 39–117)
BUN: 22 mg/dL (ref 6–23)
CO2: 31 mEq/L (ref 19–32)
Calcium: 10 mg/dL (ref 8.4–10.5)
Chloride: 100 mEq/L (ref 96–112)
Creatinine, Ser: 0.73 mg/dL (ref 0.40–1.20)
GFR: 90.48 mL/min (ref >60.00)
Glucose, Bld: 81 mg/dL (ref 70–99)
Potassium: 4.5 mEq/L (ref 3.5–5.1)
Sodium: 137 mEq/L (ref 135–145)
Total Bilirubin: 0.5 mg/dL (ref 0.2–1.2)
Total Protein: 6.9 g/dL (ref 6.0–8.3)

## 2022-11-29 LAB — CBC WITH DIFFERENTIAL/PLATELET
Basophils Absolute: 0 10*3/uL (ref 0.0–0.1)
Basophils Relative: 0.8 % (ref 0.0–3.0)
Eosinophils Absolute: 0.2 10*3/uL (ref 0.0–0.7)
Eosinophils Relative: 3.5 % (ref 0.0–5.0)
HCT: 39.2 % (ref 36.0–46.0)
Hemoglobin: 13.2 g/dL (ref 12.0–15.0)
Lymphocytes Relative: 34 % (ref 12.0–46.0)
Lymphs Abs: 1.6 10*3/uL (ref 0.7–4.0)
MCHC: 33.8 g/dL (ref 30.0–36.0)
MCV: 94.5 fl (ref 78.0–100.0)
Monocytes Absolute: 0.5 10*3/uL (ref 0.1–1.0)
Monocytes Relative: 11 % (ref 3.0–12.0)
Neutro Abs: 2.3 10*3/uL (ref 1.4–7.7)
Neutrophils Relative %: 50.7 % (ref 43.0–77.0)
Platelets: 272 10*3/uL (ref 150.0–400.0)
RBC: 4.15 Mil/uL (ref 3.87–5.11)
RDW: 13.3 % (ref 11.5–15.5)
WBC: 4.6 10*3/uL (ref 4.0–10.5)

## 2022-11-29 LAB — LIPID PANEL
Cholesterol: 189 mg/dL (ref 0–200)
HDL: 66 mg/dL (ref >39.00)
LDL Cholesterol: 108 mg/dL — ABNORMAL HIGH (ref 0–99)
NonHDL: 122.9
Total CHOL/HDL Ratio: 3
Triglycerides: 73 mg/dL (ref 0.0–149.0)
VLDL: 14.6 mg/dL (ref 0.0–40.0)

## 2022-11-29 NOTE — Progress Notes (Signed)
Subjective  Chief Complaint  Patient presents with   Annual Exam    HPI: Krystal Rose is a 57 y.o. female who presents to [facility] today for a Female Wellness Visit. She also has the concerns and/or needs as listed above in the chief complaint. These will be addressed in addition to the Health Maintenance Visit.   Wellness Visit: annual visit with health maintenance review and exam without Pap  HM: overdue for gyn exam/pap and mammo, sees Dr. Julien Girt. Overdue for surveillance colonoscopy. Dr. Burna Mortimer Chronic disease f/u and/or acute problem visit: (deemed necessary to be done in addition to the wellness visit): Seizures. Following neurology notes/ov. Adjusted meds and improving somewhat. Feels good about this Adjustment anxiety is stable on lexapro.   Assessment  1. Annual physical exam   2. Serrated polyp of colon   3. Adjustment disorder with anxiety   4. Focal seizures ([place])   5. Headache disorder      Plan  Female Wellness Visit: Age appropriate Health Maintenance and Prevention measures were discussed with patient. Included topics are cancer screening recommendations, ways to keep healthy (see AVS) including dietary and exercise recommendations, regular eye and dental care, use of seat belts, and avoidance of moderate alcohol use and tobacco use. Pt to schedule with gyn for mammo/pap and w/ GI for overdue colonoscopy BMI: discussed patient's BMI and encouraged positive lifestyle modifications to help get to or maintain a target BMI. HM needs and immunizations were addressed and ordered. See below for orders. See HM and immunization section for updates. Routine labs and screening tests ordered including cmp, cbc and lipids where appropriate. Discussed recommendations regarding Vit D and calcium supplementation (see AVS)  Chronic disease management visit and/or acute problem visit: Seizures: per neuro Adjustment anxiety: continue lexapro 10  daily. Controlled.   Follow up: Return in about 1 year (around 11/29/2023) for complete physical.  Orders Placed This Encounter  Procedures   CBC with Differential/Platelet   Comprehensive metabolic panel   Lipid panel   No orders of the defined types were placed in this encounter.     Body mass index is 22.11 kg/m. 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 Delice Lesch, neurology; MRI and EEG    Spell of altered consciousness 09/22/2018   Panic attack 09/22/2018   Serrated polyp of colon 01/06/2018    Colonoscopy 12/2017, precancerous, q 3 year recall, Dr Silverio Decamp    Headache disorder 12/23/2017    Dehudration; treated with excedrin migrain    Premature menopause 12/23/2017   Dyspareunia in female 12/23/2017    Started estradiol march 2019; menopausal    Health Maintenance  Topic Date Due   MAMMOGRAM  05/24/2020   COLONOSCOPY (Pts 45-37yr Insurance coverage will need to be confirmed)  01/02/2021   PAP SMEAR-Modifier  12/01/2022   INFLUENZA VACCINE  01/10/2023 (Originally 04/27/2022)   DTaP/Tdap/Td (2 - Td or Tdap) 12/24/2027   Hepatitis C Screening  Completed   HIV Screening  Completed   Zoster Vaccines- Shingrix  Completed   HPV VACCINES  Aged Out   COVID-19 Vaccine  Discontinued   Immunization History  Administered Date(s) Administered   Influenza,inj,Quad PF,6+ Mos 07/27/2017, 07/18/2018, 06/28/2019  Influenza-Unspecified 07/10/2013, 07/10/2014, 07/23/2015   PFIZER(Purple Top)SARS-COV-2 Vaccination 10/09/2019, 10/26/2019   Tdap 12/23/2017   Zoster Recombinat (Shingrix) 11/03/2020, 01/06/2021   We updated and reviewed the patient's past history in detail and it is documented below. Allergies: Patient is allergic to elastic bandages & [zinc]  and latex. Past Medical History Patient  has a past medical history of Allergy, Basal cell carcinoma, Benign colon polyp (01/06/2018), Focal seizures ([place]) (10/29/2019), Frequent headaches, History of whiplash injury to neck ('04), and Menopausal state. Past Surgical History Patient  has a past surgical history that includes Shoulder arthroscopy w/ rotator cuff repair (2009); Dental surgery; and basal cell carcinoma removed. Family History: Patient family history includes Arthritis in her mother; Asthma in her paternal grandfather; COPD in her paternal grandfather; Cancer in her paternal grandfather; Diabetes in her paternal grandfather; Drug abuse in her paternal grandfather; Healthy in her brother, daughter, daughter, and father; Hearing loss in her maternal grandmother and mother; Heart attack in her maternal grandmother; Heart disease in her maternal grandmother; Heart disease (age of onset: 76) in her maternal uncle; Hyperlipidemia in her mother; Hypertension in her mother; Rheum arthritis in her mother; Stomach cancer in her maternal grandfather. Social History:  Patient  reports that she has never smoked. She has never used smokeless tobacco. She reports current alcohol use of about 0.5 standard drinks of alcohol per week. She reports that she does not use drugs.  Review of Systems: Constitutional: negative for fever or malaise Ophthalmic: negative for photophobia, double vision or loss of vision Cardiovascular: negative for chest pain, dyspnea on exertion, or new LE swelling Respiratory: negative for SOB or persistent cough Gastrointestinal: negative for abdominal pain, change in bowel habits or melena Genitourinary: negative for dysuria or gross hematuria, no abnormal uterine bleeding or disharge Musculoskeletal: negative for new gait disturbance or muscular weakness Integumentary: negative for new or persistent rashes, no breast lumps Neurological: negative for TIA or stroke  symptoms Psychiatric: negative for SI or delusions Allergic/Immunologic: negative for hives  Patient Care Team    Relationship Specialty Notifications Start End  Leamon Arnt, MD PCP - General Family Medicine  12/21/17   Justice Britain, MD  Orthopedic Surgery  01/15/11   Marylynn Pearson, MD Consulting Physician Obstetrics and Gynecology  12/23/17   Jarome Matin, MD Consulting Physician Dermatology  12/23/17   Nat Christen, MD Attending Physician Optometry  12/23/17    Comment: I think they are in [place] now, [facility]  Dr. Merrilee Jansky    12/23/17    Comment: Cosmetic Denst[facility]  Cameron Sprang, MD Consulting Physician Neurology  03/26/19   Mauri Pole, MD Consulting Physician Gastroenterology  11/03/20     Objective  Vitals: BP 100/70   Pulse 76   Temp 98 F (36.7 C)   Ht '5\' 8"'$  (1.727 m)   Wt 145 lb 6.4 oz (66 kg)   SpO2 95%   BMI 22.11 kg/m  General:  Well developed, well nourished, no acute distress  Psych:  Alert and orientedx3,normal mood and affect HEENT:  Normocephalic, atraumatic, non-icteric sclera,  supple neck without adenopathy, mass or thyromegaly Cardiovascular:  Normal S1, S2, RRR without gallop, rub or murmur Respiratory:  Good breath sounds bilaterally, CTAB with normal respiratory effort Gastrointestinal: normal bowel sounds, soft, non-tender, no noted masses. No HSM MSK: no deformities, contusions. Joints are without erythema or swelling.  Skin:  Warm, no rashes or suspicious lesions noted   Commons side effects, risks, benefits, and alternatives  for medications and treatment plan prescribed today were discussed, and the patient expressed understanding of the given instructions. Patient is instructed to call or message via MyChart if he/she has any questions or concerns regarding our treatment plan. No barriers to understanding were identified. We discussed Red Flag symptoms and signs in detail. Patient expressed understanding regarding  what to do in case of urgent or emergency type symptoms.  Medication list was reconciled, printed and provided to the patient in AVS. Patient instructions and summary information was reviewed with the patient as documented in the AVS. This note was prepared with assistance of Dragon voice recognition software. Occasional wrong-word or sound-a-like substitutions may have occurred due to the inherent limitations of voice recognition software

## 2022-11-29 NOTE — Patient Instructions (Addendum)
Please return in 12 months for your annual complete physical; please come fasting.   I will release your lab results to you on your MyChart account with further instructions. You may see the results before I do, but when I review them I will send you a message with my report or have my assistant call you if things need to be discussed. Please reply to my message with any questions. Thank you!   If you have any questions or concerns, please don't hesitate to send me a message via MyChart or call the office at [phone]. Thank you for visiting with [place] today! It's our pleasure caring for you.   Please call to get set up for your colonoscopy:  Dr. Burna Mortimer Phone: [phone] Fax: [phone]   Please get scheduled with Dr. Julien Girt for your female wellness exam, pap and mammogram: Phone: [phone] Fax: [phone]

## 2022-12-15 ENCOUNTER — Encounter: Payer: Self-pay | Admitting: Neurology

## 2022-12-15 ENCOUNTER — Ambulatory Visit (INDEPENDENT_AMBULATORY_CARE_PROVIDER_SITE_OTHER): Payer: No Typology Code available for payment source | Admitting: Neurology

## 2022-12-15 VITALS — BP 113/67 | HR 72 | Ht 68.0 in | Wt 148.2 lb

## 2022-12-15 DIAGNOSIS — G40009 Localization-related (focal) (partial) idiopathic epilepsy and epileptic syndromes with seizures of localized onset, not intractable, without status epilepticus: Secondary | ICD-10-CM | POA: Diagnosis not present

## 2022-12-15 MED ORDER — CLOBAZAM 10 MG PO TABS: ORAL_TABLET | ORAL | 3 refills | Status: DC

## 2022-12-15 MED ORDER — LACOSAMIDE 200 MG PO TABS
200.0000 mg | ORAL_TABLET | Freq: Two times a day (BID) | ORAL | 3 refills | Status: DC
Start: 2022-12-15 — End: 2023-05-03

## 2022-12-15 NOTE — Progress Notes (Signed)
After a trip to [place] sleep and med times off   2/12 bed at 10 and felt seizure took lorazepam 2/13 7;30,10:30,11:30,12:30 had seizures  Hangover depression fear 2/15 at daughters house wasn't sure she could stay increase anxiety

## 2022-12-15 NOTE — Patient Instructions (Signed)
Always a pleasure to see you. Continue all your medications. See how you feel about anxiety, there is room to increase Lexapro if needed. Follow-up in 4 months, call for any changes.    Seizure Precautions: 1. If medication has been prescribed for you to prevent seizures, take it exactly as directed.  Do not stop taking the medicine without talking to your doctor first, even if you have not had a seizure in a long time.   2. Avoid activities in which a seizure would cause danger to yourself or to others.  Don't operate dangerous machinery, swim alone, or climb in high or dangerous places, such as on ladders, roofs, or girders.  Do not drive unless your doctor says you may.  3. If you have any warning that you may have a seizure, lay down in a safe place where you can't hurt yourself.    4.  No driving for 6 months from last seizure, as per [facility].   Please refer to the following link on the [place] website for more information: http://[email or web address]/[email or web address]/driving/drivingu.cfm   5.  Maintain good sleep hygiene. Avoid alcohol.  6.  Contact your doctor if you have any problems that may be related to the medicine you are taking.  7.  Call 911 and bring the patient back to the ED if:        A.  The seizure lasts longer than 5 minutes.       B.  The patient doesn't awaken shortly after the seizure  C.  The patient has new problems such as difficulty seeing, speaking or moving  D.  The patient was injured during the seizure  E.  The patient has a temperature over 102 F (39C)  F.  The patient vomited and now is having trouble breathing
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
## 2022-12-21 ENCOUNTER — Ambulatory Visit (AMBULATORY_SURGERY_CENTER): Payer: No Typology Code available for payment source | Admitting: *Deleted

## 2022-12-21 ENCOUNTER — Encounter: Payer: Self-pay | Admitting: Gastroenterology

## 2022-12-21 VITALS — Ht 68.0 in | Wt 147.0 lb

## 2022-12-21 DIAGNOSIS — Z86010 Personal history of colonic polyps: Secondary | ICD-10-CM

## 2022-12-21 MED ORDER — NA SULFATE-K SULFATE-MG SULF 17.5-3.13-1.6 GM/177ML PO SOLN: 1.0000 | Freq: Once | ORAL | 0 refills | Status: AC

## 2022-12-21 NOTE — Progress Notes (Signed)
Pt's previsit is done over the phone and all paperwork (prep instructions) sent to patient. Pt's name and DOB verified at the beginning of the previsit. Pt denies any difficulty with ambulating.  No egg or soy allergy known to patient  No issues known to pt with past sedation with any surgeries or procedures Patient denies ever being intubated No FH of Malignant Hyperthermia Pt is not on diet pills Pt is not on  home 02  Pt is not on blood thinners  Pt denies issues with constipation  Pt is not on dialysis Pt denies any upcoming cardiac testing Pt encouraged to use to use Singlecare or Goodrx to reduce cost  Patient's chart reviewed by Osvaldo Angst CNRA prior to previsit and patient appropriate for the [place].  Previsit completed and red dot placed by patient's name on their procedure day (on provider's schedule).  . Visit by phone Pt states her weight is 147lb Instructions reviewed with pt and pt states understanding. Instructed to review again prior to procedure. Pt states they will.  Instructions sent by mail with coupon and by my chart

## 2023-01-24 ENCOUNTER — Ambulatory Visit (AMBULATORY_SURGERY_CENTER): Payer: No Typology Code available for payment source | Admitting: Gastroenterology

## 2023-01-24 ENCOUNTER — Encounter: Payer: Self-pay | Admitting: Gastroenterology

## 2023-01-24 VITALS — BP 111/55 | HR 56 | Temp 98.0°F | Resp 8 | Ht 68.0 in | Wt 147.0 lb

## 2023-01-24 DIAGNOSIS — D120 Benign neoplasm of cecum: Secondary | ICD-10-CM

## 2023-01-24 DIAGNOSIS — Z09 Encounter for follow-up examination after completed treatment for conditions other than malignant neoplasm: Secondary | ICD-10-CM

## 2023-01-24 DIAGNOSIS — Z860100 Personal history of colon polyps, unspecified: Secondary | ICD-10-CM

## 2023-01-24 DIAGNOSIS — K635 Polyp of colon: Secondary | ICD-10-CM

## 2023-01-24 DIAGNOSIS — D122 Benign neoplasm of ascending colon: Secondary | ICD-10-CM | POA: Diagnosis not present

## 2023-01-24 MED ORDER — SODIUM CHLORIDE 0.9 % IV SOLN: 500.0000 mL | Freq: Once | INTRAVENOUS | Status: DC

## 2023-01-24 NOTE — Progress Notes (Signed)
Pt's states no medical or surgical changes since previsit or office visit. 

## 2023-01-24 NOTE — Progress Notes (Signed)
Last seizure 80 days ago. 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 Dr. Lavon Paganini and [facility] Monday CRNA aware, ok to proceed with procedure.

## 2023-01-24 NOTE — Op Note (Signed)
[facility] Patient Name: Krystal Rose Procedure Date: 01/24/2023 8:50 AM MRN: [id number] Endoscopist: Napoleon Form , MD, [id number] Age: 57 Referring MD:  Date of Birth: 1965-10-17 Gender: Female Account #: [id number] Procedure:                Colonoscopy Indications:              High risk colon cancer surveillance: Personal                            history of colonic polyps, High risk colon cancer                            surveillance: Personal history of adenoma (10 mm or                            greater in size), High risk colon cancer                            surveillance: Personal history of multiple (3 or                            more) adenomas Medicines:                Monitored Anesthesia Care Procedure:                Pre-Anesthesia Assessment:                           - Prior to the procedure, a History and Physical                            was performed, and patient medications and                            allergies were reviewed. The patient's tolerance of                            previous anesthesia was also reviewed. The risks                            and benefits of the procedure and the sedation                            options and risks were discussed with the patient.                            All questions were answered, and informed consent                            was obtained. Prior Anticoagulants: The patient has                            taken no anticoagulant or antiplatelet agents. ASA  Grade Assessment: II - A patient with mild systemic                            disease. After reviewing the risks and benefits,                            the patient was deemed in satisfactory condition to                            undergo the procedure.                           After obtaining informed consent, the colonoscope                            was passed under direct vision. Throughout the                             procedure, the patient's blood pressure, pulse, and                            oxygen saturations were monitored continuously. The                            Olympus PCF-H190DL (#[id number]) Colonoscope was                            introduced through the anus and advanced to the the                            cecum, identified by appendiceal orifice and                            ileocecal valve. The colonoscopy was performed                            without difficulty. The patient tolerated the                            procedure well. The quality of the bowel                            preparation was good. The ileocecal valve,                            appendiceal orifice, and rectum were photographed. Scope In: 9:08:03 AM Scope Out: 9:29:42 AM Scope Withdrawal Time: 0 hours 12 minutes 23 seconds  Total Procedure Duration: 0 hours 21 minutes 39 seconds  Findings:                 The perianal and digital rectal examinations were                            normal.  Three sessile polyps were found in the ascending                            colon and cecum. The polyps were 5 to 11 mm in                            size. These polyps were removed with a cold snare.                            Resection and retrieval were complete.                           A few small-mouthed diverticula were found in the                            sigmoid colon.                           Non-bleeding internal hemorrhoids were found during                            retroflexion. The hemorrhoids were small. Complications:            No immediate complications. Estimated Blood Loss:     Estimated blood loss was minimal. Impression:               - Three 5 to 11 mm polyps in the ascending colon                            and in the cecum, removed with a cold snare.                            Resected and retrieved.                           -  Diverticulosis in the sigmoid colon.                           - Non-bleeding internal hemorrhoids. Recommendation:           - Patient has a contact number available for                            emergencies. The signs and symptoms of potential                            delayed complications were discussed with the                            patient. Return to normal activities tomorrow.                            Written discharge instructions were provided to the  patient.                           - Resume previous diet.                           - Continue present medications.                           - Await pathology results.                           - Repeat colonoscopy in 3 years for surveillance                            based on pathology results.                           - No ibuprofen, naproxen, or other non-steroidal                            anti-inflammatory drugs for 2 weeks. Napoleon Form, MD 01/24/2023 9:37:14 AM This report has been signed electronically.

## 2023-01-24 NOTE — Progress Notes (Signed)
Report to PACU, RN, vss, BBS= Clear.  
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
## 2023-01-24 NOTE — Patient Instructions (Signed)
Thank you for coming in to see Krystal Rose today! Resume your diet and medications today. Return to regular daily activities tomorrow. Recommend next colonoscopy in 3 years, based on today's pathology results. ( 1-2 weeks) Please avoid Ibuprofen, Aleve, or other non-steroidal anti-inflammatory medications for 2 weeks.     YOU HAD AN ENDOSCOPIC PROCEDURE TODAY AT THE [facility]:   Refer to the procedure report that was given to you for any specific questions about what was found during the examination.  If the procedure report does not answer your questions, please call your gastroenterologist to clarify.  If you requested that your care partner not be given the details of your procedure findings, then the procedure report has been included in a sealed envelope for you to review at your convenience later.  YOU SHOULD EXPECT: Some feelings of bloating in the abdomen. Passage of more gas than usual.  Walking can help get rid of the air that was put into your GI tract during the procedure and reduce the bloating. If you had a lower endoscopy (such as a colonoscopy or flexible sigmoidoscopy) you may notice spotting of blood in your stool or on the toilet paper. If you underwent a bowel prep for your procedure, you may not have a normal bowel movement for a few days.  Please Note:  You might notice some irritation and congestion in your nose or some drainage.  This is from the oxygen used during your procedure.  There is no need for concern and it should clear up in a day or so.  SYMPTOMS TO REPORT IMMEDIATELY:  Following lower endoscopy (colonoscopy or flexible sigmoidoscopy):  Excessive amounts of blood in the stool  Significant tenderness or worsening of abdominal pains  Swelling of the abdomen that is new, acute  Fever of 100F or higher   For urgent or emergent issues, a gastroenterologist can be reached at any hour by calling [phone]. Do not use MyChart messaging for urgent  concerns.    DIET:  We do recommend a small meal at first, but then you may proceed to your regular diet.  Drink plenty of fluids but you should avoid alcoholic beverages for 24 hours.  ACTIVITY:  You should plan to take it easy for the rest of today and you should NOT DRIVE or use heavy machinery until tomorrow (because of the sedation medicines used during the test).    FOLLOW UP: Our staff will call the number listed on your records the next business day following your procedure.  We will call around 7:15- 8:00 am to check on you and address any questions or concerns that you may have regarding the information given to you following your procedure. If we do not reach you, we will leave a message.     If any biopsies were taken you will be contacted by phone or by letter within the next 1-3 weeks.  Please call Krystal Rose at [phone] if you have not heard about the biopsies in 3 weeks.    SIGNATURES/CONFIDENTIALITY: You and/or your care partner have signed paperwork which will be entered into your electronic medical record.  These signatures attest to the fact that that the information above on your After Visit Summary has been reviewed and is understood.  Full responsibility of the confidentiality of this discharge information lies with you and/or your care-partner.

## 2023-01-24 NOTE — Progress Notes (Signed)
Called to room to assist during endoscopic procedure.  Patient ID and intended procedure confirmed with present staff. Received instructions for my participation in the procedure from the performing physician.  

## 2023-01-25 ENCOUNTER — Telehealth: Payer: Self-pay | Admitting: *Deleted

## 2023-01-25 NOTE — Telephone Encounter (Signed)
  Follow up Call-     01/24/2023    8:19 AM  Call back number  Post procedure Call Back phone  # [phone]  Permission to leave phone message Yes     Patient questions:  Do you have a fever, pain , or abdominal swelling? No. Pain Score  0 *  Have you tolerated food without any problems? Yes.    Have you been able to return to your normal activities? Yes.    Do you have any questions about your discharge instructions: Diet   No. Medications  No. Follow up visit  No.  Do you have questions or concerns about your Care? No.  Actions: * If pain score is 4 or above: No action needed, pain <4.

## 2023-02-04 ENCOUNTER — Encounter: Payer: Self-pay | Admitting: Gastroenterology

## 2023-03-30 ENCOUNTER — Other Ambulatory Visit: Payer: Self-pay | Admitting: Neurology

## 2023-05-03 ENCOUNTER — Encounter: Payer: Self-pay | Admitting: Neurology

## 2023-05-03 ENCOUNTER — Telehealth: Payer: No Typology Code available for payment source | Admitting: Neurology

## 2023-05-03 VITALS — Ht 68.0 in | Wt 146.0 lb

## 2023-05-03 DIAGNOSIS — G40009 Localization-related (focal) (partial) idiopathic epilepsy and epileptic syndromes with seizures of localized onset, not intractable, without status epilepticus: Secondary | ICD-10-CM | POA: Diagnosis not present

## 2023-05-03 MED ORDER — LACOSAMIDE 200 MG PO TABS: 200.0000 mg | ORAL_TABLET | Freq: Two times a day (BID) | ORAL | 3 refills | Status: DC

## 2023-05-03 MED ORDER — ESCITALOPRAM OXALATE 20 MG PO TABS: 20.0000 mg | ORAL_TABLET | Freq: Every day | ORAL | 3 refills | Status: DC

## 2023-05-03 MED ORDER — CLOBAZAM 10 MG PO TABS: ORAL_TABLET | ORAL | 3 refills | Status: DC

## 2023-05-03 NOTE — Progress Notes (Signed)
Virtual Visit via Video Note The purpose of this virtual visit is to provide medical care while limiting exposure to the novel coronavirus.    Consent was obtained for video visit:  Yes.   Answered questions that patient had about telehealth interaction:  Yes.     Pt location: Home Physician Location: office Name of referring provider:  Willow Ora, MD I connected with Krystal Rose at patients initiation/request on 05/03/2023 at  3:30 PM EDT by video enabled telemedicine application and verified that I am speaking with the correct person using two identifiers. Pt MRN:  [id number] Pt DOB:  [birth year] Video Participants:  Krystal Rose   History of Present Illness:  The patient had a virtual video visit on 05/03/2023. She was last seen 5 months ago for seizures. Since her last visit, she is happy to report that she has not had any typical seizures since 10/2022. She is on Lacosamide 200mg  BID and Clobazam 15mg  at bedtime. She reports an episode last May while driving looking at houses, she was looking back and forth when she suddenly felt a flash similar to a feeling of claustrophobia where all of a sudden she had to get out. It hit immediately and was extremely brief, she put the car in park and rolled down the window. She decided to walk back home. She felt the hangover feeling for the next 2 days, feeling foggyheaded and just not clear/connected. Aside from the post-event symptoms, there were no typical seizure sensations with it. On 5/31, she was shopping and looked to a certain area, feeling/sensing the possibility of a seizure. It was very mild, she looked to the right and the feeling faded away. She has had a few similar episode but nothing that progresses. She has had several flashes of panic/anxiety, mostly while in the car. She has only taken prn lorazepam two times in the past 5 months.   She has found that [employer] has become a source of stress/anxiety/panic. She was pushing  the buggy 3 weeks ago, went to the Frozen section, took 2 steps then made a U-turn saying to herself that "this looks like a seizure." Sensation stopped when she came out. Another time she was carrying a big palette in the store, she got uptight and worried she would have a seizure and called her mother to talk to her. Sometimes she has phantosmia, for 2-3 days she smelled bacon and eggs in her house for 10-30 minutes. Another time she smelled smoke and cat litter/poop in [place] beach [place]. She has noticed a sensation of tingling near her bra line on the right, it feels like her back is being rained on. It usually occurs when she is active. There is an area in her spine around this spot that bothers her. She still has the twitches in small and large muscle groups, having at least 10 a day. She was watching a movie one time and had 5. Sometimes her arm jerks or she would spill a drink. These may be more with increase in Clobazam. She is still a little bit clumsy, she does not see what is right in front of her feet. She has stepped on her cat several times.     History on Initial Assessment 03/27/2018: This is a very pleasant 57 year old right-handed woman with no significant past medical history, in her usual state of health until around October 2018 when she started having recurrent episodes of deja vu with associated epigastric sensation. The  first episode occurred when she woke up in the morning, she felt a strange sensation almost like a memory of when her college girls were young. This was followed by a terrible sinking in her stomach like a drop on a roller coaster, then a wave of deep despair. Symptoms lasted 30 seconds or so, but recalls the sensation as feeling "horrible and painful." She had 2-3 of these over the next month or so, all occurring upon awakening. For a month she was symptom-free, then symptoms recurred occurring every 1-2 weeks, sometimes in clusters of 3-4 in 2 weeks. She described some of  them, in February she had 4 in one night. In March, she had 3 episodes. With one of them, she had 5 in one day, one with associated lightheadedness like she would faint, feeling tingly in her face, then another where she felt tingling all over. The next day, she had a milder episode while in bed, again with some kind of memory like a classroom/library. In May she had 2 episodes in one day upon waking, she felt cool/tingly around the lips. She had 4 episodes in June, with one of them she had a familiar memory of a person who she was not sure if she knew, then her stomach felt upset with nausea and lip tingling. The next day she had 3 milder episodes early morning, waking up up from sleep. She had 2 more that day lasting 1-2 minutes. The last episode occurred on 03/24/18, it was different because she was looking at a specific accent table picture online and felt the same sensation, she tried to scroll through other tables, then came back to the same table and again had the sensation. There is no associated olfactory or gustatory hallucination except for one time when she had several in one night and smelled lemons in their bedroom. No associated focal weakness or post-event fatigue. No staring/unresponsive episodes, gaps in time, or myoclonic jerks. Sometimes she feels a sensation that it is going to happen, like if she looks to the right side it may occur. No clear triggers, maybe sleep. She gets an average of 6-7 hours of sleep. She has had headaches recently, but unrelated to these. She has headaches around once a week, either on the right hemisphere or the top of her head, no associated nausea/vomiting/phono/photophobia, with good response to Excedrin migraine. She feels the vision on her right eye varies, she had an eye exam with no significant abnormality reported. She recalls one episode 2 weeks ago while drinking 1 glass of martini when she felt she was slurring her speech for an hour, no other associated  symptoms. She had pain in her shoulder blades 11 months ago and could not move for 3 days, this occurred at the peak of her stress when her daughter went to start [employer]. She reports that all of the above symptoms started around the time of stress with her daughter.    Epilepsy Risk Factors:  She had a normal birth and early development.  There is no history of febrile convulsions, CNS infections such as meningitis/encephalitis, significant traumatic brain injury, neurosurgical procedures, or family history of seizures.   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  She had a routine EEG which was normal, her 48-hour EEG in 03/2018 showed rare left temporal focal slowing, no epileptiform discharges.  She had 2 episodes where she had the epigastric sensation with no EEG changes seen.   Prior AEDs: oxcarbazepine (hyponatremia, Na 123), Oxtellar XR   Current Outpatient Medications on File Prior to Visit  Medication Sig Dispense Refill   cloBAZam (ONFI) 10 MG tablet TAKE 1 & 1/2 TABLETS BY MOUTH EVERY NIGHT 135 tablet 3   escitalopram (LEXAPRO) 10 MG tablet TAKE 1 TABLET BY MOUTH EVERY DAY 90 tablet 1   lacosamide (VIMPAT) 200 MG TABS tablet Take 1 tablet (200 mg total) by mouth 2 (two) times daily. 180 tablet 3   LORazepam (ATIVAN) 0.5 MG tablet TAKE 1 TABLET AS NEEDED FOR SEIZURE CLUSTERS. DO NOT TAKE MORE THAN 3 IN A WEEK 10 tablet 5   No current facility-administered medications on file prior to visit.     Observations/Objective:   Vitals:   05/03/23 1326  Weight: 146 lb (66.2 kg)  Height: 5\' 8"  (1.727 m)   GEN:  The patient appears stated age and is in NAD.  Neurological examination: Patient is awake, alert. No aphasia or dysarthria. Intact fluency and comprehension. Cranial nerves: Extraocular movements intact. No facial  asymmetry. Motor: moves all extremities symmetrically, at least anti-gravity x 4.     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 MRI brain unremarkable, her 48-hour EEG had shown rare left temporal slowing, no epileptiform discharges. There were 2 episodes of epigastric sensation with no EEG correlate. 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 No typical seizures since 10/2022. She has had the post-ictal symptoms 3 months ago however there also appears to be a component of anxiety. We agreed to increase Lexapro to 20mg  daily. Continue Lacosamide 200mg  BID and Clobazam 15mg  at bedtime. She has prn lorazepam for clusters. She is aware of [place] driving laws to stop driving after a seizure until 6 months seizure-free. Follow-up in 4 months, call for any changes.    Follow Up Instructions:   -I discussed the assessment and treatment plan with the patient. The patient was provided an opportunity to ask questions and all were answered. The patient agreed with the plan and demonstrated an understanding of the instructions.   The patient was advised to call back or seek an in-person evaluation if the symptoms worsen or if the condition fails to improve as anticipated.     Van Clines, MD

## 2023-05-03 NOTE — Patient Instructions (Signed)
Always good to see you!  Increase Lexapro to 20mg : take 1 tablet daily  2. Continue all your other medications  3. Continue symptom diary  4. Follow-up in 4 months,call for any changes   Seizure Precautions: 1. If medication has been prescribed for you to prevent seizures, take it exactly as directed.  Do not stop taking the medicine without talking to your doctor first, even if you have not had a seizure in a long time.   2. Avoid activities in which a seizure would cause danger to yourself or to others.  Don't operate dangerous machinery, swim alone, or climb in high or dangerous places, such as on ladders, roofs, or girders.  Do not drive unless your doctor says you may.  3. If you have any warning that you may have a seizure, lay down in a safe place where you can't hurt yourself.    4.  No driving for 6 months from last seizure, as per [facility].   Please refer to the following link on the [employer]'s website for more information: http://www.epilepsyfoundation.org/answerplace/Social/driving/drivingu.cfm   5.  Maintain good sleep hygiene. Avoid alcohol.  6.  Contact your doctor if you have any problems that may be related to the medicine you are taking.  7.  Call 911 and bring the patient back to the ED if:        A.  The seizure lasts longer than 5 minutes.       B.  The patient doesn't awaken shortly after the seizure  C.  The patient has new problems such as difficulty seeing, speaking or moving  D.  The patient was injured during the seizure  E.  The patient has a temperature over 102 F (39C)  F.  The patient vomited and now is having trouble breathing

## 2023-06-28 ENCOUNTER — Telehealth: Payer: Self-pay | Admitting: Neurology

## 2023-06-28 NOTE — Telephone Encounter (Signed)
Ok for verbal on this, thanks

## 2023-06-28 NOTE — Telephone Encounter (Signed)
Pharmacy declined early refill. Called patient and made her aware that first refill date would be June 30 2023

## 2023-06-28 NOTE — Telephone Encounter (Signed)
Patient is needing permisson to get CloBAZam 10mg  filled two days early patient is going out of town. [facility] [email or web address]

## 2023-09-07 ENCOUNTER — Encounter: Payer: Self-pay | Admitting: Neurology

## 2023-09-07 ENCOUNTER — Ambulatory Visit: Payer: No Typology Code available for payment source | Admitting: Neurology

## 2023-09-07 VITALS — BP 107/66 | HR 72 | Ht 68.0 in | Wt 155.4 lb

## 2023-09-07 DIAGNOSIS — G40009 Localization-related (focal) (partial) idiopathic epilepsy and epileptic syndromes with seizures of localized onset, not intractable, without status epilepticus: Secondary | ICD-10-CM
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
## 2023-09-07 NOTE — Patient Instructions (Signed)
Always a pleasure to see you.  Continue Lacosamide and Clobazam, let me know if refills are needed before our next appointment  2. Once ready, reduce Lexapro (Escitalopram) to 15mg  daily. If no issues, can try reducing to 10mg  daily. Let me know what dose to refill once you are settled  3. Follow-up in 4 months, call for any changes   Seizure Precautions: 1. If medication has been prescribed for you to prevent seizures, take it exactly as directed.  Do not stop taking the medicine without talking to your doctor first, even if you have not had a seizure in a long time.   2. Avoid activities in which a seizure would cause danger to yourself or to others.  Don't operate dangerous machinery, swim alone, or climb in high or dangerous places, such as on ladders, roofs, or girders.  Do not drive unless your doctor says you may.  3. If you have any warning that you may have a seizure, lay down in a safe place where you can't hurt yourself.    4.  No driving for 6 months from last seizure, as per [facility].   Please refer to the following link on the Epilepsy Foundation of America's website for more information: http://www.epilepsyfoundation.org/answerplace/Social/driving/drivingu.cfm   5.  Maintain good sleep hygiene. Avoid alcohol.  6.  Contact your doctor if you have any problems that may be related to the medicine you are taking.  7.  Call 911 and bring the patient back to the ED if:        A.  The seizure lasts longer than 5 minutes.       B.  The patient doesn't awaken shortly after the seizure  C.  The patient has new problems such as difficulty seeing, speaking or moving  D.  The patient was injured during the seizure  E.  The patient has a temperature over 102 F (39C)  F.  The patient vomited and now is having trouble breathing

## 2023-10-17 ENCOUNTER — Encounter: Payer: Self-pay | Admitting: Neurology

## 2023-11-02 ENCOUNTER — Other Ambulatory Visit: Payer: Self-pay | Admitting: Neurology

## 2023-11-03 ENCOUNTER — Other Ambulatory Visit: Payer: Self-pay | Admitting: Neurology

## 2023-11-04 ENCOUNTER — Other Ambulatory Visit: Payer: Self-pay | Admitting: Neurology

## 2023-11-04 MED ORDER — CLOBAZAM 10 MG PO TABS: ORAL_TABLET | ORAL | 3 refills | Status: DC

## 2023-11-04 NOTE — Telephone Encounter (Signed)
 1. Which medications need to be refilled? (please list name of each medication and dose if known) cloBAZam  (ONFI ) 10 MG tablet   2. Which pharmacy/location (including street and city if local pharmacy) is medication to be sent to? [id number] [place], [place] - [address]   Patient stated she is completely out of medication.

## 2024-01-06 ENCOUNTER — Telehealth: Payer: No Typology Code available for payment source | Admitting: Neurology

## 2024-01-06 ENCOUNTER — Encounter: Payer: Self-pay | Admitting: Neurology

## 2024-01-06 VITALS — BP 119/80 | Ht 67.5 in | Wt 154.0 lb

## 2024-01-06 DIAGNOSIS — R400 Somnolence: Secondary | ICD-10-CM | POA: Diagnosis not present

## 2024-01-06 DIAGNOSIS — G40009 Localization-related (focal) (partial) idiopathic epilepsy and epileptic syndromes with seizures of localized onset, not intractable, without status epilepticus: Secondary | ICD-10-CM | POA: Diagnosis not present

## 2024-01-06 MED ORDER — CLOBAZAM 10 MG PO TABS: ORAL_TABLET | ORAL | 3 refills | Status: DC

## 2024-01-06 MED ORDER — LACOSAMIDE 200 MG PO TABS: 200.0000 mg | ORAL_TABLET | Freq: Two times a day (BID) | ORAL | 3 refills | Status: DC

## 2024-01-06 NOTE — Patient Instructions (Signed)
 Always a pleasure to see you.  Schedule home sleep study  2. Continue all your medications. Let me know when you need refills for Lexapro, at which point I will send in for Lexapro 10mg : take 1 tablet daily  3. Continue symptom calendar, follow-up in 5 months, call for any changes   Seizure Precautions: 1. If medication has been prescribed for you to prevent seizures, take it exactly as directed.  Do not stop taking the medicine without talking to your doctor first, even if you have not had a seizure in a long time.   2. Avoid activities in which a seizure would cause danger to yourself or to others.  Don't operate dangerous machinery, swim alone, or climb in high or dangerous places, such as on ladders, roofs, or girders.  Do not drive unless your doctor says you may.  3. If you have any warning that you may have a seizure, lay down in a safe place where you can't hurt yourself.    4.  No driving for 6 months from last seizure, as per [facility].   Please refer to the following link on the Epilepsy Foundation of America's website for more information: http://www.epilepsyfoundation.org/answerplace/Social/driving/drivingu.cfm   5.  Maintain good sleep hygiene. Avoid alcohol.  6.  Contact your doctor if you have any problems that may be related to the medicine you are taking.  7.  Call 911 and bring the patient back to the ED if:        A.  The seizure lasts longer than 5 minutes.       B.  The patient doesn't awaken shortly after the seizure  C.  The patient has new problems such as difficulty seeing, speaking or moving  D.  The patient was injured during the seizure  E.  The patient has a temperature over 102 F (39C)  F.  The patient vomited and now is having trouble breathing

## 2024-01-06 NOTE — Progress Notes (Signed)
 Virtual Visit via Video Note The purpose of this virtual visit is to provide medical care while limiting exposure to the novel coronavirus.    Consent was obtained for video visit:  Yes.   Answered questions that patient had about telehealth interaction:  Yes.    Pt location: Home Physician Location: office Name of referring provider:  Willow Ora, MD I connected with Krystal Rose at patients initiation/request on 01/06/2024 at  4:00 PM EDT by video enabled telemedicine application and verified that I am speaking with the correct person using two identifiers. Pt MRN:  [id number] Pt DOB:  1966/07/13 Video Participants:  Krystal Rose   History of Present Illness:  The patient had a virtual video visit on 01/06/2024. She was last seen in the neurology clinic 4 months ago for seizures. She is happy to report being 14 months seizure-free tomorrow. She is on Lacosamide 200mg  BID and Clobazam 15mg  at bedtime. She has occasional flashes of a "moment of something," she is not sure if it is panic or anxiety, lasting just a nanosecond. She recalls 1-2 weeks ago there was a 48-hour period where she had 3 or 4 flashes, it was a very stressful period. No associated headaches. She had a flash today, she looked to the left and something "made me flash." She denies any deja vu, staring/unresponsive episodes. She denies any headaches, dizziness, focal numbness/tingling/weakness. She has occasional loss of balance, no falls. She is still sleepy all the time but states it does not bother her. Her husband has noticed she snores. She is happy to report that she can feel her brain is happy again. Lexapro was reduced to 10mg  daily on last visit. She feels good on this dose but notes that Saturday she started feeling anxiety and just walked away.    History on Initial Assessment 03/27/2018: This is a very pleasant 58 year old right-handed woman with no significant past medical history, in her usual state of  health until around October 2018 when she started having recurrent episodes of deja vu with associated epigastric sensation. 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 This was followed by a terrible sinking in her stomach like a drop on a roller coaster, then a wave of deep despair. Symptoms lasted 30 seconds or so, but recalls the sensation as feeling "horrible and painful." She had 2-3 of these over the next month or so, all occurring upon awakening. For a month she was symptom-free, then symptoms recurred occurring every 1-2 weeks, sometimes in clusters of 3-4 in 2 weeks. She described some of them, in February she had 4 in one night. In March, she had 3 episodes. With one of them, she had 5 in one day, one with associated lightheadedness like she would faint, feeling tingly in her face, then another where she felt tingling all over. The next day, she had a milder episode while in bed, again with some kind of memory like a classroom/library. In May she had 2 episodes in one day upon waking, she felt cool/tingly around the lips. She had 4 episodes in June, with one of them she had a familiar memory of a person who she was not sure if she knew, then her stomach felt upset with nausea and lip tingling. The next day she had 3 milder episodes early morning, waking up up from sleep. She had 2  more that day lasting 1-2 minutes. The last episode occurred on 03/24/18, it was different because she was looking at a specific accent table picture online and felt the same sensation, she tried to scroll through other tables, then came back to the same table and again had the sensation. There is no associated olfactory or gustatory hallucination except for one time when she had several in one night and smelled lemons in their bedroom. No associated focal weakness or post-event fatigue. No staring/unresponsive episodes, gaps  in time, or myoclonic jerks. Sometimes she feels a sensation that it is going to happen, like if she looks to the right side it may occur. No clear triggers, maybe sleep. She gets an average of 6-7 hours of sleep. She has had headaches recently, but unrelated to these. She has headaches around once a week, either on the right hemisphere or the top of her head, no associated nausea/vomiting/phono/photophobia, with good response to Excedrin migraine. She feels the vision on her right eye varies, she had an eye exam with no significant abnormality reported. She recalls one episode 2 weeks ago while drinking 1 glass of martini when she felt she was slurring her speech for an hour, no other associated symptoms. She had pain in her shoulder blades 11 months ago and could not move for 3 days, this occurred at the peak of her stress when her daughter went to start [employer]. She reports that all of the above symptoms started around the time of stress with her daughter.    Epilepsy Risk Factors:  She had a normal birth and early development.  There is no history of febrile convulsions, CNS infections such as meningitis/encephalitis, significant traumatic brain injury, neurosurgical procedures, or family history of seizures.   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  She had a routine EEG which was normal, her 48-hour EEG in 03/2018 showed rare left temporal focal slowing, no epileptiform discharges. She had 2 episodes where she had the epigastric sensation with no EEG changes seen.   Prior AEDs: oxcarbazepine (hyponatremia, Na 123), Oxtellar XR    Current Outpatient Medications on File Prior to Visit  Medication Sig Dispense Refill   cloBAZam (ONFI) 10 MG tablet TAKE 1 & 1/2 TABLETS BY MOUTH EVERY NIGHT 135 tablet 3   escitalopram (LEXAPRO) 20  MG tablet Take 1 tablet (20 mg total) by mouth daily. (Patient taking differently: Take 10 mg by mouth daily.) 90 tablet 3   lacosamide (VIMPAT) 200 MG TABS tablet TAKE 1 TABLET BY MOUTH TWICE A DAY 180 tablet 3   LORazepam (ATIVAN) 0.5 MG tablet TAKE 1 TABLET AS NEEDED FOR SEIZURE CLUSTERS. DO NOT TAKE MORE THAN 3 IN A WEEK 10 tablet 5   No current facility-administered medications on file prior to visit.     Observations/Objective:   Vitals:   01/06/24 1442  BP: 119/80  Weight: 154 lb (69.9 kg)  Height: 5' 7.5" (1.715 m)   GEN:  The patient appears stated age and is in NAD.  Neurological examination: Patient is awake, alert. No aphasia or dysarthria. Intact fluency and comprehension. Cranial nerves: Extraocular movements intact. No facial asymmetry. Motor: moves all extremities symmetrically, at least anti-gravity x 4.    Assessment and Plan:   This is a very pleasant 58 yo RH with recurrent stereotyped episodes of deja vu  with epigastric sensation since 2018, some of them waking her up from sleep. MRI brain unremarkable, her 48-hour EEG had shown rare left temporal slowing, no epileptiform discharges. There were 2 episodes of epigastric sensation with no EEG correlate. 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 She has been doing well with no typical seizures since 10/2022 on Lacosamide 200mg  BID and Clobazam 15mg  at bedtime. She reports daytime drowsiness and some snoring, we discussed doing a home sleep study to assess for OSA. We discussed mood, she feels good on Lexapro 10mg  daily, monitor anxiety. She will let us  know when she needs refills and a 10mg  prescription will be sent (she is currently doing 20mg  1/2 tablet daily). She is aware of [place] driving laws to stop driving after an episode of loss of awareness until 6 months seizure-free. Continue symptom calendar. Follow-up in 5 months, call for any changes.    Follow Up  Instructions:    -I discussed the assessment and treatment plan with the patient. The patient was provided an opportunity to ask questions and all were answered. The patient agreed with the plan and demonstrated an understanding of the instructions.   The patient was advised to call back or seek an in-person evaluation if the symptoms worsen or if the condition fails to improve as anticipated.     Jhonny Moss, MD

## 2024-01-27 ENCOUNTER — Other Ambulatory Visit: Payer: Self-pay | Admitting: Neurology

## 2024-01-27 ENCOUNTER — Telehealth: Payer: Self-pay | Admitting: Neurology

## 2024-01-27 MED ORDER — CLOBAZAM 10 MG PO TABS: ORAL_TABLET | ORAL | 3 refills | Status: DC

## 2024-01-27 NOTE — Telephone Encounter (Signed)
 Pls let her know I sent in Rx with note for early refill for travel. Can you also pls call pharmacy to make sure they get it before her flight, thanks

## 2024-01-27 NOTE — Telephone Encounter (Signed)
 Called pharmacy at [facility] and they did not have the medicine for pt. So they said [facility] had it, called them and they did not have. Called Pt to make her aware. She is already in [place]. Her husband is flying down today and he was going to pick it up. I told her I had one more place to call and she reported not to put a lot of time in it that she would possibly come back before then. She has 5 days of med. I then called [facility] and they have it and Dr. Ty Gales just needs to send the order there. Called husband to let him know. He was happy and thank us .

## 2024-01-27 NOTE — Telephone Encounter (Signed)
 1. Which medications need refilled? (List name and dosage, if known) clobazam  - needs it early due to family emergency in [place] She will run out while there. [facility] Flight is at noon  2. Which pharmacy/location is medication to be sent to? (include street and city if local pharmacy) [facility]

## 2024-01-31 MED ORDER — CLOBAZAM 10 MG PO TABS: ORAL_TABLET | ORAL | 3 refills | Status: DC

## 2024-01-31 NOTE — Telephone Encounter (Signed)
 Rx sent to new pharmacy requested

## 2024-01-31 NOTE — Addendum Note (Signed)
 Addended by: Jhonny Moss on: 01/31/2024 02:05 PM   Modules accepted: Orders

## 2024-02-21 ENCOUNTER — Ambulatory Visit (HOSPITAL_BASED_OUTPATIENT_CLINIC_OR_DEPARTMENT_OTHER): Attending: Neurology | Admitting: Internal Medicine

## 2024-02-21 DIAGNOSIS — R400 Somnolence: Secondary | ICD-10-CM | POA: Insufficient documentation

## 2024-03-03 DIAGNOSIS — R400 Somnolence: Secondary | ICD-10-CM

## 2024-03-03 NOTE — Procedures (Signed)
 Krystal Rose [facility] [address] [place], [place] [address] Tel: [phone]   Fax: [phone]  Home Sleep Test Interpretation  Patient Name: Krystal, Rose Date: 02/21/2024  Date of Birth: [birth year] Study Type: HST  Age: 58 year MRN #: [id number]  Sex: Female Interpreting Physician: Krystal Rose [id number]  Height: 5\' 8"  Referring Physician: Rayfield Cairo, MD  Weight: 154.0 lbs Recording Tech: Krystal Rose RPSGT Rose  BMI: 23.6 Scoring Tech: Krystal Rose Krystal Rose  ESS: 12 Neck Size: 13.75   Indications for Polysomnography The patient is a 58 year-old Female who is 5\' 8"  and weighs 154.0 lbs. Her BMI equals 23.6.  A home sleep apnea test was performed to evaluate for -.  Medication  No Data.   Polysomnogram Data A home sleep test recorded the standard physiologic parameters including EKG, nasal and oral airflow.  Respiratory parameters of chest and abdominal movements were recorded with Respiratory Inductance Plethysmography belts.  Oxygen saturation was recorded by pulse oximetry.   Study Architecture The total recording time of the polysomnogram was 495.3 minutes.  The total monitoring time was 496.0 minutes.  Time spent in Supine position was 83.5 minutes.   Respiratory Events The study revealed a presence of 17 obstructive, 2 central, and - mixed apneas resulting in an Apnea index of 2.3 events per hour.  There were 3 hypopneas (>=3% desaturation and/or arousal) resulting in an Apnea\Hypopnea Index (AHI >=3% desaturation and/or arousal) of 2.7 events per hour.  There were 1 hypopneas (>=4% desaturation) resulting in an Apnea\Hypopnea Index (AHI >=4% desaturation) of 2.4 events per hour.  There were - Respiratory Effort Related Arousals resulting in a RERA index of - events per hour. The Respiratory Disturbance Index is 2.7 events per hour.  The snore index was - events per hour.  Mean oxygen saturation was 95.9%.  The  lowest oxygen saturation during monitoring time was 91.0%.  Time spent <=88% oxygen saturation was - minutes (-).  Cardiac Summary The average pulse rate was 63.7 bpm.  The minimum pulse rate was 53.0 bpm while the maximum pulse rate was 88.0 bpm.  Cardiac rhythm was normal/abnormal.  Comment: Occasional apneas and hypopneas, within normal limits, AHI (3%) 2.7/hr. Snoring with oxygen desaturation to a nadir of 91%, mean 95.9%. See tech comments at end of this report.  Diagnosis: Normal  Recommendations: None   This study was personally reviewed and electronically signed by: Krystal Rose D. Krystal Newsom, MD Accredited Board Certified in Sleep Medicine Date/Time: 03/03/24  10:53   Study Overview  Recording Time: 672.5 min. Monitoring Time: 496.0 min.  Analysis Start:  10:01:40 PM Supine Time: 83.5 min.  Analysis Stop:  06:16:55 AM     Study Summary   Count Index Longest Event Duration  Apneas & Hypopneas: 22 2.7  Apneas: 20.1 sec.     Hypopneas: 54.3 sec.  RERAs: - - - sec.  Desaturations: 9 1.1 72.2 sec.  Snores: - - - sec.    Minimum Oxygen Saturation: 91.0%    Respiratory Summary   Total Duration Supine Non-Supine   Count Index Average Longest Count Index Count Index  Obstructive Apnea 17 2.1 15.5 20.1 4 2.9 13 1.9   Mixed Apnea - - - - - - - -   Central Apnea 2 0.2 14.4 14.7 - - 2 0.3   Total Apneas 19 2.3 15.4 20.1 4 2.9 15 2.2            Hypopneas  3% 3 0.4 N.A. N.A. 2 1.4 1 0.1   Apneas & Hyp. 3% 22 2.7 N.A. N.A. 6 4.3 16 2.3            Hypopneas 4% 1 0.1 N.A. N.A. - - 1 0.1  Apneas & Hyp. 4% 20 2.4 N.A. N.A. 4 2.9 16 2.3             RERAs - - - - - - - -  RDI 23 2.8 N.A. N.A. 7 5.0 16 2.3   Oxygen Saturation Summary   Total Supine Non-Supine  Average SpO2 95.9% 94.6% 96.2%  Minimum SpO2 91.0% 91.0% 92.0%   Maximum SpO2 100.0% 99.0% 100.0%   Oxygen Saturation Distribution  Range (%) Time in range (min) Time in range (%)  90.0 - 100.0 493.7 100.0%  80.0 - 90.0 - -   70.0 - 80.0 - -  60.0 - 70.0 - -  50.0 - 60.0 - -  0.0 - 50.0 - -  Time Spent <=88% SpO2  Range (%) Time in range (min) Time in range (%)  0.0 - 88.0 - -  Cardiac Summary   Total Supine Non-Supine  Average Pulse Rate (BPM) 63.7 63.8 63.7  Minimum Pulse Rate (BPM) 53.0 53.0 56.0  Maximum Pulse Rate (BPM) 88.0 81.0 88.0                      Technologist Comments  When Krystal Rose was in the [facility] to pick up her HST device she had general questions about sleep apnea. I am noting the rest of the conversation here as it pertains to her sleep and may be helpful with her study.  Krystal Rose feels that her current medications have a lot to do with her daytime sleepiness. She reports she could fall asleep here in the lab if sitting quietly for a bit. She takes her meds at 9am and 9pm. She notes the sleepy effect 1-2 hours after she takes the meds. She does consume caffeine in the morning only, 1 cup of coffee. She has no trouble falling asleep at night at bedtime.  She has not been observed snoring, and her husband tells her she "moans in her sleep". She reports he sometimes wakes her up because she's moaning loud or with breathing. She reports she sleeps mostly in the prone position and on her sides, rarely supine. She thinks the moaning happens more in prone position.   [employer], RPSGT                         [employer], Biomedical engineer of Sleep Medicine  ELECTRONICALLY SIGNED ON:  03/03/2024, 10:54 AM [facility] PH: [phone]   FX: [phone] ACCREDITED BY THE [employer]ICAN ACADEMY OF SLEEP MEDICINE

## 2024-03-05 ENCOUNTER — Telehealth: Payer: Self-pay

## 2024-03-05 NOTE — Telephone Encounter (Signed)
 Pt called no answer left a voice mail to call the office back when she calls back I will let her know that  sleep study is normal, no sleep apnea.

## 2024-03-05 NOTE — Telephone Encounter (Signed)
-----   Message from Jhonny Moss sent at 03/04/2024  9:24 AM EDT ----- Regarding: sleep study results Pls let her know the sleep study is normal, no sleep apnea. Thanks ----- Message ----- From: Faustina Hood, MD Sent: 03/03/2024  10:56 AM EDT To: Jhonny Moss, MD

## 2024-03-05 NOTE — Telephone Encounter (Signed)
 Pt called an informed that her sleep study is normal, no sleep apnea.

## 2024-06-08 ENCOUNTER — Ambulatory Visit: Admitting: Neurology

## 2024-07-11 ENCOUNTER — Other Ambulatory Visit: Payer: Self-pay | Admitting: Neurology

## 2024-08-07 ENCOUNTER — Encounter: Payer: Self-pay | Admitting: Neurology

## 2024-08-27 ENCOUNTER — Telehealth: Payer: Self-pay | Admitting: Neurology

## 2024-08-27 MED ORDER — LORAZEPAM 0.5 MG PO TABS: ORAL_TABLET | ORAL | 0 refills | Status: AC

## 2024-08-27 MED ORDER — ESCITALOPRAM OXALATE 20 MG PO TABS: 20.0000 mg | ORAL_TABLET | Freq: Every day | ORAL | 1 refills | Status: DC

## 2024-08-27 MED ORDER — CLOBAZAM 10 MG PO TABS: ORAL_TABLET | ORAL | 0 refills | Status: DC

## 2024-08-27 MED ORDER — LACOSAMIDE 200 MG PO TABS: 200.0000 mg | ORAL_TABLET | Freq: Two times a day (BID) | ORAL | 5 refills | Status: AC

## 2024-08-27 NOTE — Telephone Encounter (Signed)
 Rx sent.

## 2024-08-27 NOTE — Telephone Encounter (Signed)
 Pt would like RX sent to new pharmacy one month supply sent to last until appointment

## 2024-08-27 NOTE — Telephone Encounter (Signed)
 Pt called in this morning. Pt wants all her prescriptions to go to : [facility] [address] [place] [address], their number is [phone]. Thanks

## 2024-09-20 ENCOUNTER — Other Ambulatory Visit: Payer: Self-pay | Admitting: Neurology

## 2024-09-28 ENCOUNTER — Other Ambulatory Visit: Payer: Self-pay | Admitting: Neurology

## 2024-10-09 ENCOUNTER — Ambulatory Visit: Admitting: Neurology

## 2025-01-21 ENCOUNTER — Ambulatory Visit: Admitting: Neurology
